# Patient Record
Sex: Female | Born: 1975 | Hispanic: No | Marital: Married | Smoking: Former smoker
Health system: Southern US, Community
[De-identification: ages and names within clinical notes are randomized; demographics above are authoritative.]

## PROBLEM LIST (undated history)

## (undated) DIAGNOSIS — M26629 Arthralgia of temporomandibular joint, unspecified side: Secondary | ICD-10-CM

## (undated) DIAGNOSIS — F418 Other specified anxiety disorders: Secondary | ICD-10-CM

## (undated) DIAGNOSIS — M542 Cervicalgia: Principal | ICD-10-CM

## (undated) DIAGNOSIS — K219 Gastro-esophageal reflux disease without esophagitis: Secondary | ICD-10-CM

## (undated) DIAGNOSIS — D219 Benign neoplasm of connective and other soft tissue, unspecified: Secondary | ICD-10-CM

## (undated) DIAGNOSIS — F419 Anxiety disorder, unspecified: Secondary | ICD-10-CM

## (undated) DIAGNOSIS — G8929 Other chronic pain: Principal | ICD-10-CM

## (undated) HISTORY — DX: Other chronic pain: G89.29

## (undated) HISTORY — DX: Arthralgia of temporomandibular joint, unspecified side: M26.629

## (undated) HISTORY — DX: Other specified anxiety disorders: F41.8

## (undated) HISTORY — DX: Cervicalgia: M54.2

## (undated) HISTORY — PX: NO PAST SURGERIES: SHX2092

## (undated) HISTORY — DX: Gastro-esophageal reflux disease without esophagitis: K21.9

---

## 1997-12-07 ENCOUNTER — Encounter: Admission: RE | Admit: 1997-12-07 | Discharge: 1997-12-07 | Payer: Self-pay | Admitting: Internal Medicine

## 1998-03-03 ENCOUNTER — Other Ambulatory Visit: Admission: RE | Admit: 1998-03-03 | Discharge: 1998-03-03 | Payer: Self-pay | Admitting: Obstetrics

## 1998-03-03 ENCOUNTER — Encounter: Admission: RE | Admit: 1998-03-03 | Discharge: 1998-03-03 | Payer: Self-pay | Admitting: Obstetrics

## 1998-04-14 ENCOUNTER — Encounter: Admission: RE | Admit: 1998-04-14 | Discharge: 1998-04-14 | Payer: Self-pay | Admitting: Obstetrics

## 1998-05-17 ENCOUNTER — Encounter: Admission: RE | Admit: 1998-05-17 | Discharge: 1998-05-17 | Payer: Self-pay | Admitting: Hematology and Oncology

## 1998-06-22 ENCOUNTER — Encounter: Admission: RE | Admit: 1998-06-22 | Discharge: 1998-06-22 | Payer: Self-pay | Admitting: Hematology and Oncology

## 1999-01-23 ENCOUNTER — Inpatient Hospital Stay (HOSPITAL_COMMUNITY): Admission: AD | Admit: 1999-01-23 | Discharge: 1999-01-23 | Payer: Self-pay | Admitting: Obstetrics & Gynecology

## 1999-06-01 ENCOUNTER — Encounter: Admission: RE | Admit: 1999-06-01 | Discharge: 1999-06-01 | Payer: Self-pay | Admitting: Obstetrics

## 1999-07-03 ENCOUNTER — Ambulatory Visit (HOSPITAL_COMMUNITY): Admission: RE | Admit: 1999-07-03 | Discharge: 1999-07-03 | Payer: Self-pay | Admitting: Infectious Diseases

## 1999-08-03 ENCOUNTER — Encounter: Admission: RE | Admit: 1999-08-03 | Discharge: 1999-08-03 | Payer: Self-pay | Admitting: Obstetrics

## 1999-09-11 ENCOUNTER — Encounter: Admission: RE | Admit: 1999-09-11 | Discharge: 1999-09-11 | Payer: Self-pay | Admitting: Internal Medicine

## 1999-10-20 ENCOUNTER — Encounter: Admission: RE | Admit: 1999-10-20 | Discharge: 1999-10-20 | Payer: Self-pay | Admitting: Internal Medicine

## 2000-05-10 ENCOUNTER — Encounter: Admission: RE | Admit: 2000-05-10 | Discharge: 2000-05-10 | Payer: Self-pay | Admitting: Internal Medicine

## 2000-07-15 ENCOUNTER — Encounter: Payer: Self-pay | Admitting: Internal Medicine

## 2000-07-15 ENCOUNTER — Ambulatory Visit (HOSPITAL_COMMUNITY): Admission: RE | Admit: 2000-07-15 | Discharge: 2000-07-15 | Payer: Self-pay | Admitting: Internal Medicine

## 2000-07-19 ENCOUNTER — Ambulatory Visit (HOSPITAL_COMMUNITY): Admission: RE | Admit: 2000-07-19 | Discharge: 2000-07-19 | Payer: Self-pay | Admitting: Internal Medicine

## 2000-07-19 ENCOUNTER — Encounter: Payer: Self-pay | Admitting: Internal Medicine

## 2000-10-10 ENCOUNTER — Encounter: Admission: RE | Admit: 2000-10-10 | Discharge: 2000-10-10 | Payer: Self-pay | Admitting: Obstetrics

## 2001-02-19 ENCOUNTER — Inpatient Hospital Stay (HOSPITAL_COMMUNITY): Admission: AD | Admit: 2001-02-19 | Discharge: 2001-02-19 | Payer: Self-pay | Admitting: Obstetrics

## 2001-05-15 ENCOUNTER — Encounter: Admission: RE | Admit: 2001-05-15 | Discharge: 2001-05-15 | Payer: Self-pay | Admitting: Obstetrics

## 2001-06-11 ENCOUNTER — Encounter: Admission: RE | Admit: 2001-06-11 | Discharge: 2001-06-11 | Payer: Self-pay | Admitting: Internal Medicine

## 2001-12-18 ENCOUNTER — Encounter: Admission: RE | Admit: 2001-12-18 | Discharge: 2001-12-18 | Payer: Self-pay | Admitting: Internal Medicine

## 2002-04-02 ENCOUNTER — Encounter: Admission: RE | Admit: 2002-04-02 | Discharge: 2002-04-02 | Payer: Self-pay | Admitting: Obstetrics and Gynecology

## 2002-04-02 ENCOUNTER — Encounter (INDEPENDENT_AMBULATORY_CARE_PROVIDER_SITE_OTHER): Payer: Self-pay | Admitting: Specialist

## 2003-02-01 ENCOUNTER — Encounter: Admission: RE | Admit: 2003-02-01 | Discharge: 2003-02-01 | Payer: Self-pay | Admitting: Internal Medicine

## 2003-02-03 ENCOUNTER — Encounter: Admission: RE | Admit: 2003-02-03 | Discharge: 2003-02-03 | Payer: Self-pay | Admitting: Internal Medicine

## 2004-02-01 ENCOUNTER — Encounter: Admission: RE | Admit: 2004-02-01 | Discharge: 2004-02-01 | Payer: Self-pay | Admitting: Internal Medicine

## 2004-10-21 ENCOUNTER — Inpatient Hospital Stay (HOSPITAL_COMMUNITY): Admission: AD | Admit: 2004-10-21 | Discharge: 2004-10-21 | Payer: Self-pay | Admitting: Obstetrics & Gynecology

## 2004-10-26 ENCOUNTER — Ambulatory Visit: Payer: Self-pay | Admitting: Internal Medicine

## 2005-05-07 ENCOUNTER — Ambulatory Visit: Payer: Self-pay | Admitting: Internal Medicine

## 2007-11-13 ENCOUNTER — Inpatient Hospital Stay (HOSPITAL_COMMUNITY): Admission: AD | Admit: 2007-11-13 | Discharge: 2007-11-14 | Payer: Self-pay | Admitting: Obstetrics & Gynecology

## 2007-11-16 ENCOUNTER — Inpatient Hospital Stay (HOSPITAL_COMMUNITY): Admission: AD | Admit: 2007-11-16 | Discharge: 2007-11-17 | Payer: Self-pay | Admitting: Obstetrics

## 2007-11-17 ENCOUNTER — Encounter (INDEPENDENT_AMBULATORY_CARE_PROVIDER_SITE_OTHER): Payer: Self-pay | Admitting: Internal Medicine

## 2007-11-17 ENCOUNTER — Ambulatory Visit: Payer: Self-pay | Admitting: Internal Medicine

## 2007-11-17 DIAGNOSIS — N920 Excessive and frequent menstruation with regular cycle: Secondary | ICD-10-CM

## 2007-11-17 LAB — CONVERTED CEMR LAB
Basophils Absolute: 0 10*3/uL (ref 0.0–0.1)
HCT: 38.3 % (ref 36.0–46.0)
HCV Ab: NEGATIVE
Hep A Total Ab: NEGATIVE
Hep B Core Total Ab: NEGATIVE
Hepatitis B Surface Ag: NEGATIVE
Lymphocytes Relative: 15 % (ref 12–46)
Lymphs Abs: 1.4 10*3/uL (ref 0.7–4.0)
Neutro Abs: 7.1 10*3/uL (ref 1.7–7.7)
Neutrophils Relative %: 75 % (ref 43–77)
Platelets: 222 10*3/uL (ref 150–400)
RDW: 13 % (ref 11.5–15.5)
WBC: 9.4 10*3/uL (ref 4.0–10.5)

## 2007-11-20 ENCOUNTER — Ambulatory Visit: Payer: Self-pay | Admitting: Infectious Disease

## 2007-12-17 ENCOUNTER — Encounter (INDEPENDENT_AMBULATORY_CARE_PROVIDER_SITE_OTHER): Payer: Self-pay | Admitting: Internal Medicine

## 2007-12-17 ENCOUNTER — Ambulatory Visit: Payer: Self-pay | Admitting: *Deleted

## 2007-12-17 DIAGNOSIS — A5601 Chlamydial cystitis and urethritis: Secondary | ICD-10-CM

## 2007-12-17 LAB — CONVERTED CEMR LAB
Bilirubin Urine: NEGATIVE
Candida species: NEGATIVE
GC Probe Amp, Genital: NEGATIVE
Ketones, ur: NEGATIVE mg/dL
RBC / HPF: NONE SEEN (ref <3)
Specific Gravity, Urine: 1.006 (ref 1.005–1.03)
Urine Glucose: NEGATIVE mg/dL
WBC, UA: NONE SEEN cells/hpf (ref <3)
pH: 7 (ref 5.0–8.0)

## 2007-12-20 ENCOUNTER — Emergency Department (HOSPITAL_COMMUNITY): Admission: EM | Admit: 2007-12-20 | Discharge: 2007-12-20 | Payer: Self-pay | Admitting: Emergency Medicine

## 2007-12-23 ENCOUNTER — Ambulatory Visit (HOSPITAL_COMMUNITY): Admission: RE | Admit: 2007-12-23 | Discharge: 2007-12-23 | Payer: Self-pay | Admitting: Internal Medicine

## 2007-12-23 ENCOUNTER — Encounter: Payer: Self-pay | Admitting: Internal Medicine

## 2008-01-08 DIAGNOSIS — J029 Acute pharyngitis, unspecified: Secondary | ICD-10-CM | POA: Insufficient documentation

## 2008-01-13 ENCOUNTER — Ambulatory Visit: Payer: Self-pay | Admitting: *Deleted

## 2008-01-13 ENCOUNTER — Encounter (INDEPENDENT_AMBULATORY_CARE_PROVIDER_SITE_OTHER): Payer: Self-pay | Admitting: *Deleted

## 2008-04-21 ENCOUNTER — Ambulatory Visit: Payer: Self-pay | Admitting: Internal Medicine

## 2008-04-21 ENCOUNTER — Encounter: Payer: Self-pay | Admitting: Internal Medicine

## 2008-04-21 DIAGNOSIS — J069 Acute upper respiratory infection, unspecified: Secondary | ICD-10-CM | POA: Insufficient documentation

## 2008-07-02 ENCOUNTER — Telehealth: Payer: Self-pay | Admitting: *Deleted

## 2008-07-05 ENCOUNTER — Encounter (INDEPENDENT_AMBULATORY_CARE_PROVIDER_SITE_OTHER): Payer: Self-pay | Admitting: Internal Medicine

## 2008-07-05 ENCOUNTER — Ambulatory Visit: Payer: Self-pay | Admitting: *Deleted

## 2008-07-05 DIAGNOSIS — R42 Dizziness and giddiness: Secondary | ICD-10-CM

## 2008-07-05 LAB — CONVERTED CEMR LAB
Albumin: 4.2 g/dL (ref 3.5–5.2)
Alkaline Phosphatase: 49 units/L (ref 39–117)
BUN: 11 mg/dL (ref 6–23)
Beta hcg, urine, semiquantitative: NEGATIVE
CO2: 24 meq/L (ref 19–32)
Cortisol, Plasma: 11.1 ug/dL
Ferritin: 9 ng/mL — ABNORMAL LOW (ref 10–291)
Glucose, Bld: 84 mg/dL (ref 70–99)
Hemoglobin: 13 g/dL (ref 12.0–15.0)
Iron: 98 ug/dL (ref 42–145)
MCHC: 32.8 g/dL (ref 30.0–36.0)
MCV: 96.8 fL (ref 78.0–100.0)
Potassium: 4.8 meq/L (ref 3.5–5.3)
RBC: 4.09 M/uL (ref 3.87–5.11)
Total Bilirubin: 0.6 mg/dL (ref 0.3–1.2)

## 2008-07-07 ENCOUNTER — Telehealth (INDEPENDENT_AMBULATORY_CARE_PROVIDER_SITE_OTHER): Payer: Self-pay | Admitting: Internal Medicine

## 2008-09-14 ENCOUNTER — Telehealth: Payer: Self-pay | Admitting: Internal Medicine

## 2008-10-27 ENCOUNTER — Ambulatory Visit: Payer: Self-pay | Admitting: *Deleted

## 2008-10-27 ENCOUNTER — Encounter (INDEPENDENT_AMBULATORY_CARE_PROVIDER_SITE_OTHER): Payer: Self-pay | Admitting: Internal Medicine

## 2008-10-28 ENCOUNTER — Encounter (INDEPENDENT_AMBULATORY_CARE_PROVIDER_SITE_OTHER): Payer: Self-pay | Admitting: Internal Medicine

## 2008-10-28 LAB — CONVERTED CEMR LAB
Candida species: NEGATIVE
Chlamydia, DNA Probe: NEGATIVE

## 2008-11-20 ENCOUNTER — Emergency Department (HOSPITAL_COMMUNITY): Admission: EM | Admit: 2008-11-20 | Discharge: 2008-11-20 | Payer: Self-pay | Admitting: Emergency Medicine

## 2009-04-16 IMAGING — US US ABDOMEN COMPLETE
1 series · 14 of 25 positions shown · non-contrast
Comparison: None available.

CLINICAL DATA: Right-sided abdominal pain.

ABDOMEN ULTRASOUND
TECHNIQUE: Complete abdominal ultrasound examination was performed
including evaluation of the liver, gallbladder, bile ducts,
pancreas, kidneys, spleen, IVC, and abdominal aorta.

[Series 1: unknown · 0.34mm/px · 14 of 53 slices shown]
[im 1/53]
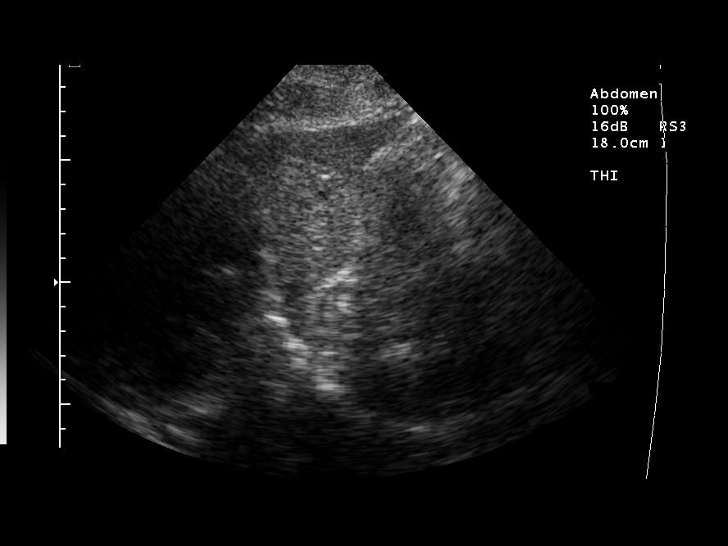
[im 5/53]
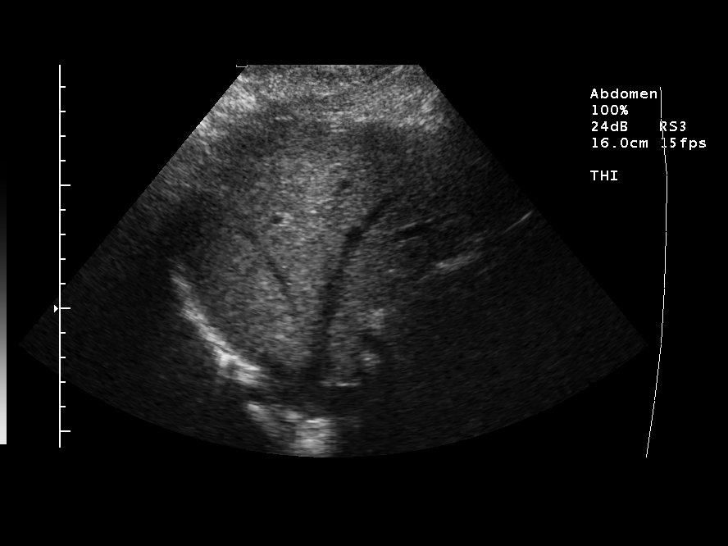
[im 9/53]
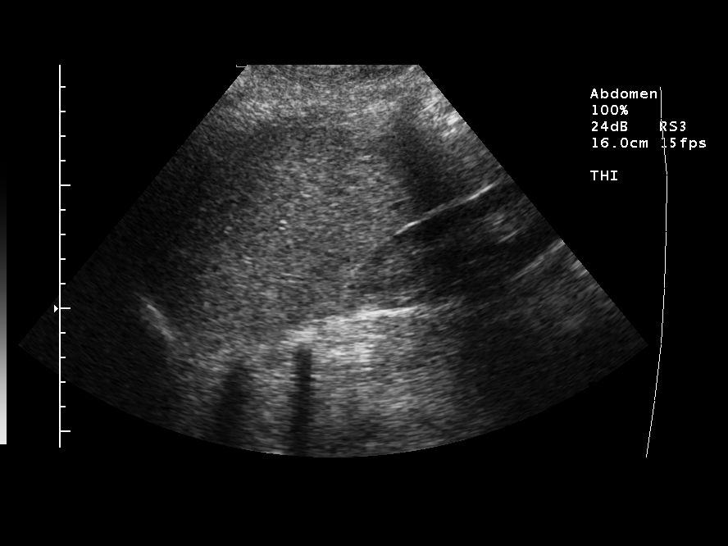
[im 14/53]
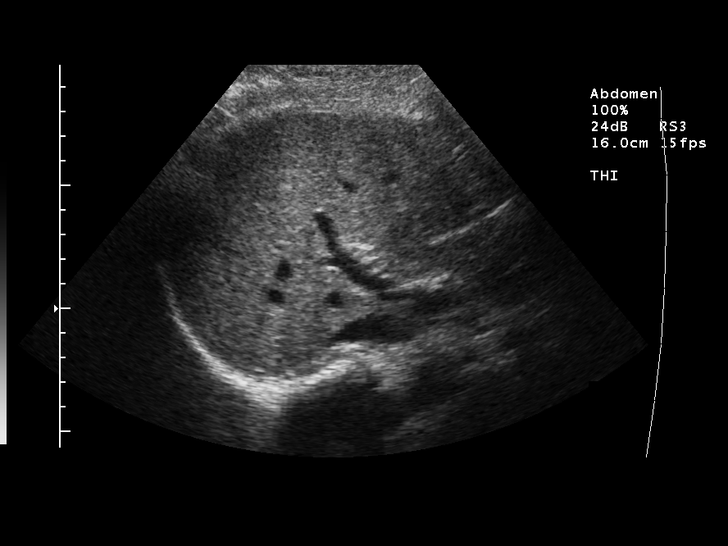
[im 18/53]
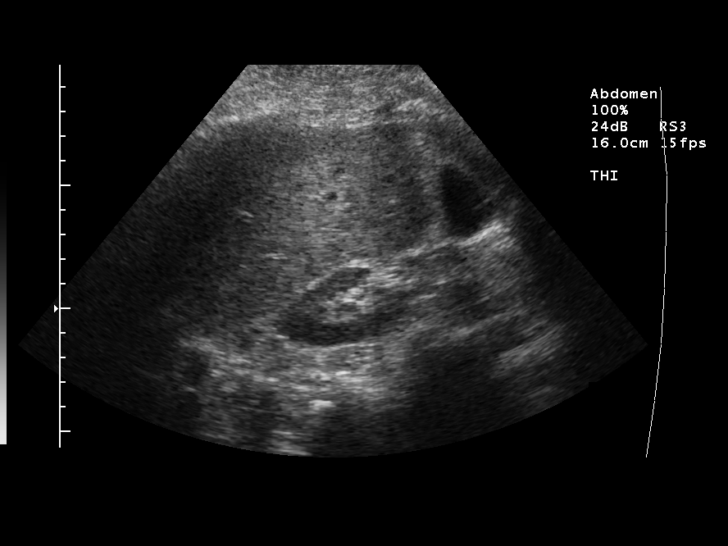
[im 20/53]
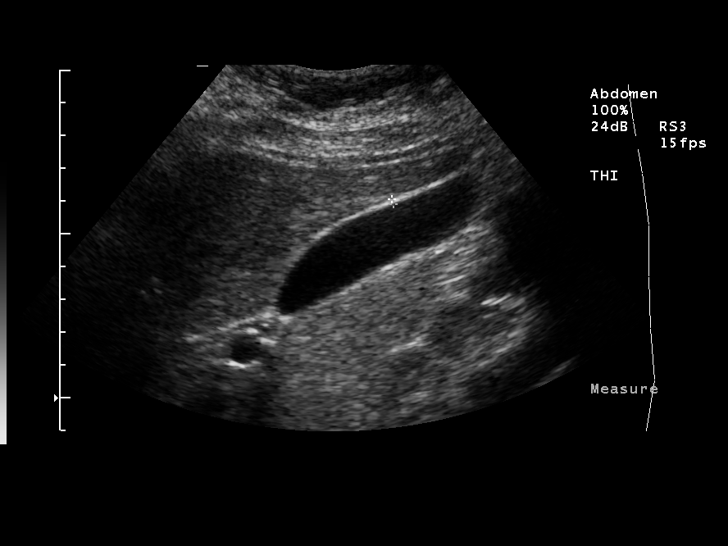
[im 24/53]
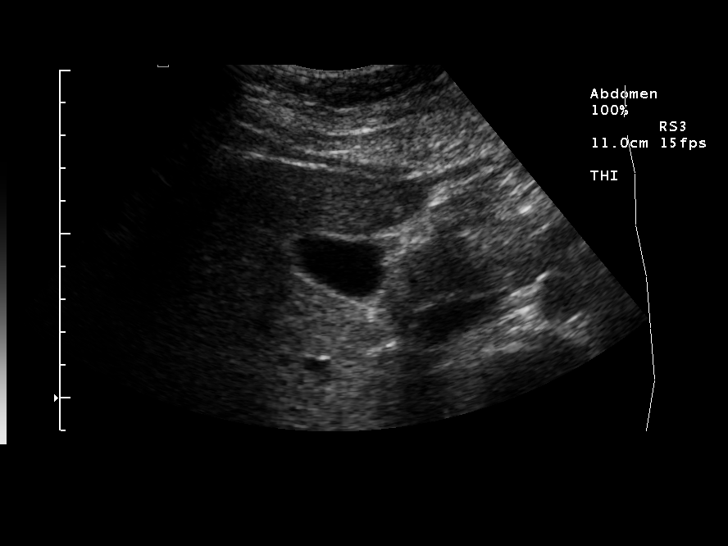
[im 29/53]
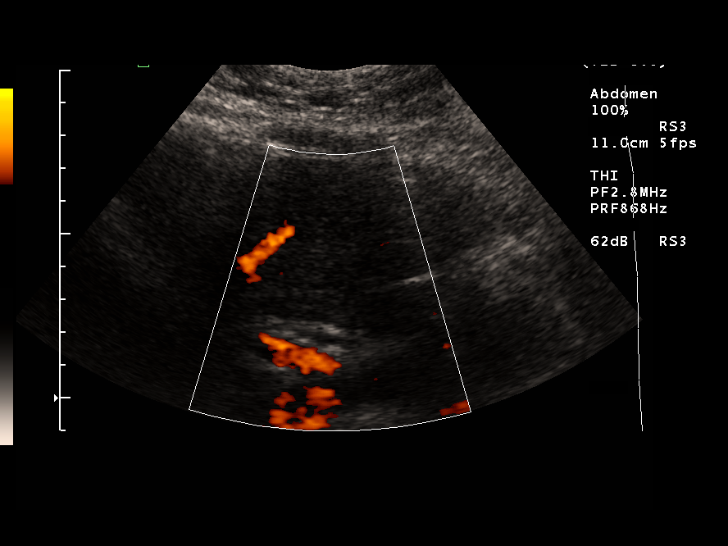
[im 33/53]
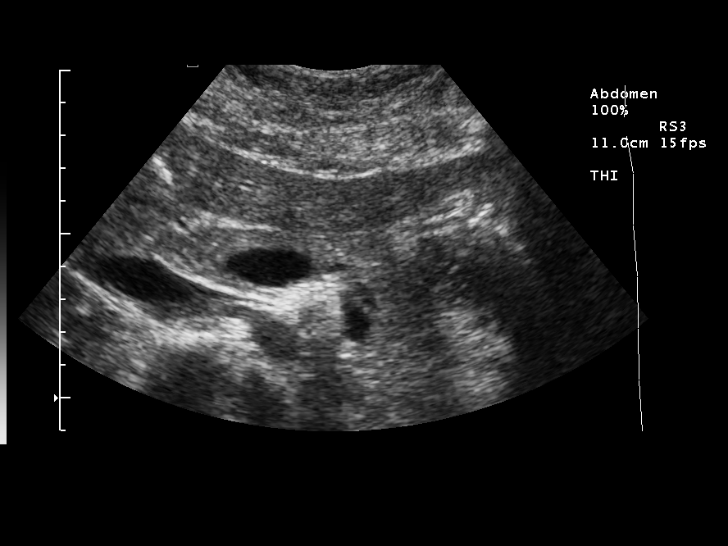
[im 35/53]
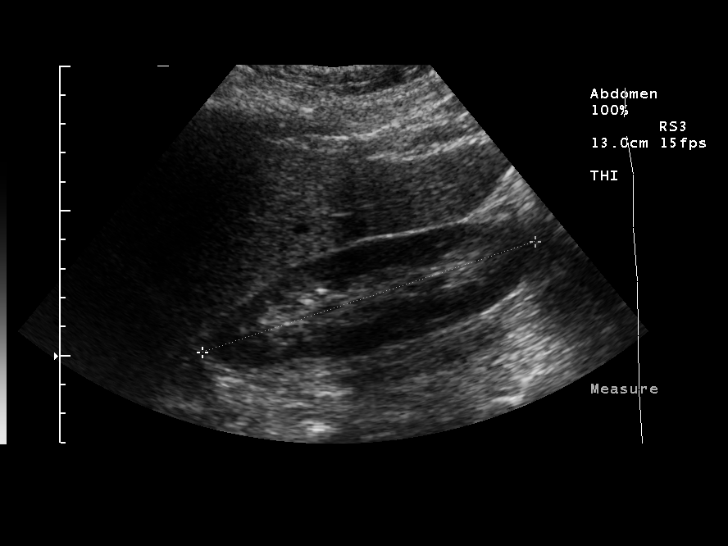
[im 40/53]
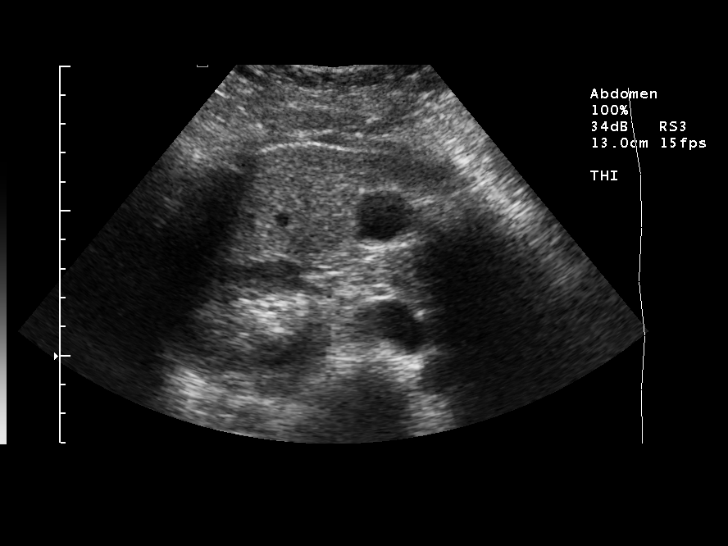
[im 44/53]
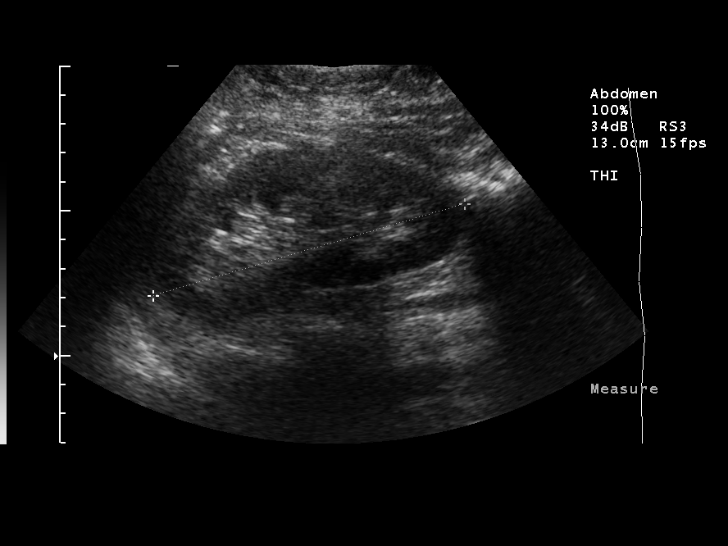
[im 48/53]
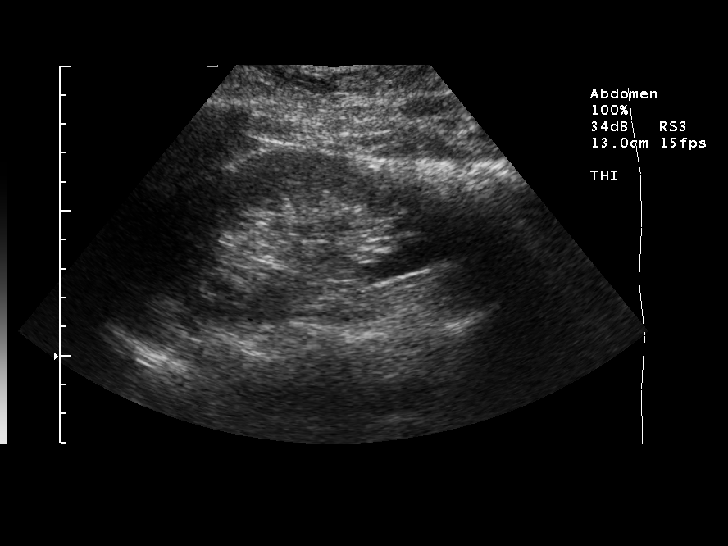
[im 53/53]
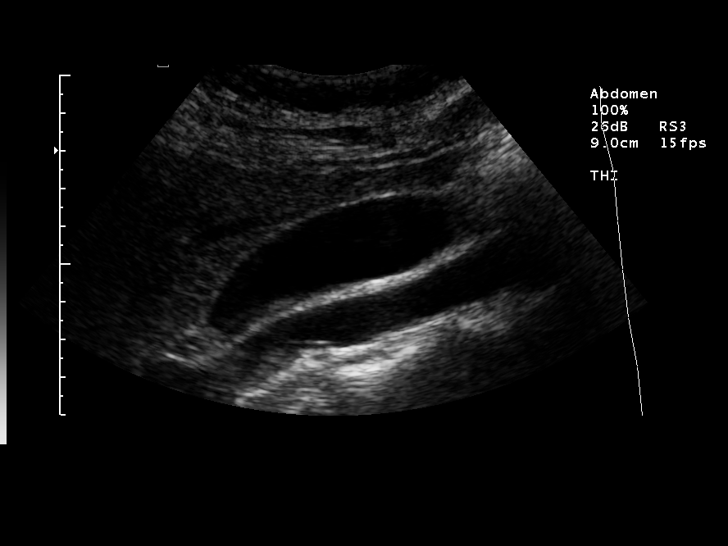

[14 of 25 positions shown; findings below may reference images not displayed]

FINDINGS: The gallbladder appears normal without stones, wall
thickening or pericholecystic fluid.

Common bile duct is normal 4.0 mm.

The liver, inferior vena cava and pancreas all appear normal.

The spleen measures 8.8 cm in length and appears normal.

Both the right and left kidney measure 12.1 cm and appear normal.

No abdominal aortic aneurysm.
IMPRESSION: Negative exam.  Specifically, the gallbladder appears normal.

## 2009-09-18 ENCOUNTER — Emergency Department (HOSPITAL_BASED_OUTPATIENT_CLINIC_OR_DEPARTMENT_OTHER): Admission: EM | Admit: 2009-09-18 | Discharge: 2009-09-19 | Payer: Self-pay | Admitting: Emergency Medicine

## 2009-09-19 ENCOUNTER — Ambulatory Visit: Payer: Self-pay | Admitting: Diagnostic Radiology

## 2009-11-13 ENCOUNTER — Emergency Department (HOSPITAL_BASED_OUTPATIENT_CLINIC_OR_DEPARTMENT_OTHER): Admission: EM | Admit: 2009-11-13 | Discharge: 2009-11-13 | Payer: Self-pay | Admitting: Emergency Medicine

## 2010-05-06 ENCOUNTER — Ambulatory Visit: Payer: Self-pay | Admitting: Obstetrics and Gynecology

## 2010-05-06 ENCOUNTER — Inpatient Hospital Stay (HOSPITAL_COMMUNITY): Admission: AD | Admit: 2010-05-06 | Discharge: 2010-05-07 | Payer: Self-pay | Admitting: Obstetrics

## 2010-05-09 ENCOUNTER — Inpatient Hospital Stay (HOSPITAL_COMMUNITY): Admission: AD | Admit: 2010-05-09 | Discharge: 2010-05-09 | Payer: Self-pay | Admitting: Obstetrics

## 2010-05-09 ENCOUNTER — Ambulatory Visit: Payer: Self-pay | Admitting: Gynecology

## 2010-05-11 ENCOUNTER — Inpatient Hospital Stay (HOSPITAL_COMMUNITY): Admission: AD | Admit: 2010-05-11 | Discharge: 2010-05-11 | Payer: Self-pay | Admitting: Obstetrics & Gynecology

## 2010-05-13 ENCOUNTER — Inpatient Hospital Stay (HOSPITAL_COMMUNITY): Admission: AD | Admit: 2010-05-13 | Discharge: 2010-05-13 | Payer: Self-pay | Admitting: Obstetrics & Gynecology

## 2010-09-02 ENCOUNTER — Encounter: Payer: Self-pay | Admitting: Obstetrics

## 2010-09-03 ENCOUNTER — Encounter: Payer: Self-pay | Admitting: Internal Medicine

## 2010-10-25 LAB — CBC
Hemoglobin: 11.9 g/dL — ABNORMAL LOW (ref 12.0–15.0)
Platelets: 272 10*3/uL (ref 150–400)
RBC: 3.8 MIL/uL — ABNORMAL LOW (ref 3.87–5.11)
WBC: 8.8 10*3/uL (ref 4.0–10.5)

## 2010-10-25 LAB — DIFFERENTIAL
Basophils Absolute: 0.1 10*3/uL (ref 0.0–0.1)
Basophils Relative: 1 % (ref 0–1)
Eosinophils Absolute: 0.2 10*3/uL (ref 0.0–0.7)
Monocytes Relative: 8 % (ref 3–12)
Neutro Abs: 5.5 10*3/uL (ref 1.7–7.7)
Neutrophils Relative %: 63 % (ref 43–77)

## 2010-10-25 LAB — CREATININE, SERUM
Creatinine, Ser: 0.74 mg/dL (ref 0.4–1.2)
GFR calc Af Amer: >60 mL/min (ref >60)
GFR calc non Af Amer: >60 mL/min (ref >60)

## 2010-10-25 LAB — AST: AST: 21 U/L (ref 0–37)

## 2010-10-25 LAB — BUN: BUN: 8 mg/dL (ref 6–23)

## 2010-10-26 LAB — CBC
HCT: 36.2 % (ref 36.0–46.0)
Hemoglobin: 11.7 g/dL — ABNORMAL LOW (ref 12.0–15.0)
Hemoglobin: 12.1 g/dL (ref 12.0–15.0)
MCH: 30.9 pg (ref 26.0–34.0)
MCH: 31 pg (ref 26.0–34.0)
Platelets: 248 10*3/uL (ref 150–400)
RBC: 3.76 MIL/uL — ABNORMAL LOW (ref 3.87–5.11)
RBC: 3.9 MIL/uL (ref 3.87–5.11)

## 2010-10-26 LAB — HCG, QUANTITATIVE, PREGNANCY
hCG, Beta Chain, Quant, S: 276 m[IU]/mL — ABNORMAL HIGH (ref <5)
hCG, Beta Chain, Quant, S: 787 m[IU]/mL — ABNORMAL HIGH (ref <5)
hCG, Beta Chain, Quant, S: 959 m[IU]/mL — ABNORMAL HIGH (ref <5)

## 2010-10-26 LAB — GC/CHLAMYDIA PROBE AMP, GENITAL: Chlamydia, DNA Probe: NEGATIVE

## 2010-10-26 LAB — ABO/RH: ABO/RH(D): A POS

## 2010-10-26 LAB — WET PREP, GENITAL

## 2010-11-01 LAB — URINE MICROSCOPIC-ADD ON

## 2010-11-01 LAB — BASIC METABOLIC PANEL
CO2: 21 mEq/L (ref 19–32)
Chloride: 105 mEq/L (ref 96–112)
GFR calc Af Amer: >60 mL/min (ref >60)
Potassium: 4.1 mEq/L (ref 3.5–5.1)

## 2010-11-01 LAB — URINALYSIS, ROUTINE W REFLEX MICROSCOPIC
Nitrite: NEGATIVE
Specific Gravity, Urine: 1.014 (ref 1.005–1.030)
pH: 6 (ref 5.0–8.0)

## 2010-11-01 LAB — PREGNANCY, URINE: Preg Test, Ur: NEGATIVE

## 2010-12-04 ENCOUNTER — Inpatient Hospital Stay (HOSPITAL_COMMUNITY)
Admission: AD | Admit: 2010-12-04 | Discharge: 2010-12-04 | Disposition: A | Discharge status: Home / Self Care | Payer: BC Managed Care – PPO | Source: Ambulatory Visit | Attending: Obstetrics | Admitting: Obstetrics

## 2010-12-04 ENCOUNTER — Inpatient Hospital Stay (HOSPITAL_COMMUNITY): Payer: BC Managed Care – PPO

## 2010-12-04 DIAGNOSIS — N938 Other specified abnormal uterine and vaginal bleeding: Secondary | ICD-10-CM | POA: Insufficient documentation

## 2010-12-04 DIAGNOSIS — N949 Unspecified condition associated with female genital organs and menstrual cycle: Secondary | ICD-10-CM | POA: Insufficient documentation

## 2010-12-04 LAB — URINE MICROSCOPIC-ADD ON

## 2010-12-04 LAB — URINALYSIS, ROUTINE W REFLEX MICROSCOPIC
Bilirubin Urine: NEGATIVE
Glucose, UA: NEGATIVE mg/dL
Ketones, ur: NEGATIVE mg/dL
Protein, ur: NEGATIVE mg/dL

## 2010-12-04 LAB — WET PREP, GENITAL
Trich, Wet Prep: NONE SEEN
Yeast Wet Prep HPF POC: NONE SEEN

## 2010-12-04 LAB — CBC
MCV: 89.8 fL (ref 78.0–100.0)
Platelets: 259 10*3/uL (ref 150–400)
RDW: 15.7 % — ABNORMAL HIGH (ref 11.5–15.5)
WBC: 7.9 10*3/uL (ref 4.0–10.5)

## 2010-12-23 ENCOUNTER — Encounter: Payer: Self-pay | Admitting: Internal Medicine

## 2011-03-08 ENCOUNTER — Encounter (HOSPITAL_COMMUNITY): Payer: Self-pay

## 2011-03-08 ENCOUNTER — Inpatient Hospital Stay (HOSPITAL_COMMUNITY)
Admission: AD | Admit: 2011-03-08 | Discharge: 2011-03-08 | Disposition: A | Discharge status: Home / Self Care | Payer: BC Managed Care – PPO | Source: Ambulatory Visit | Attending: Obstetrics | Admitting: Obstetrics

## 2011-03-08 DIAGNOSIS — N898 Other specified noninflammatory disorders of vagina: Secondary | ICD-10-CM | POA: Insufficient documentation

## 2011-03-08 DIAGNOSIS — B373 Candidiasis of vulva and vagina: Secondary | ICD-10-CM

## 2011-03-08 DIAGNOSIS — B3731 Acute candidiasis of vulva and vagina: Secondary | ICD-10-CM | POA: Insufficient documentation

## 2011-03-08 DIAGNOSIS — N949 Unspecified condition associated with female genital organs and menstrual cycle: Secondary | ICD-10-CM | POA: Insufficient documentation

## 2011-03-08 DIAGNOSIS — F17200 Nicotine dependence, unspecified, uncomplicated: Secondary | ICD-10-CM | POA: Insufficient documentation

## 2011-03-08 HISTORY — DX: Anxiety disorder, unspecified: F41.9

## 2011-03-08 LAB — WET PREP, GENITAL: Trich, Wet Prep: NONE SEEN

## 2011-03-08 LAB — POCT PREGNANCY, URINE: Preg Test, Ur: NEGATIVE

## 2011-03-08 NOTE — Progress Notes (Signed)
Pt states she feels bloated, is in a committed relationship, [place] had un[place] ? Yeast infection.

## 2011-03-08 NOTE — Progress Notes (Signed)
Pt states she has had a little pain with urinating and vaginal itch and some pain with intercourse. Has been using OTC Monistat thinking it is a yeast infection.

## 2011-03-08 NOTE — ED Provider Notes (Signed)
History     Chief Complaint  Patient presents with  . Vaginal Discharge   The history is provided by the patient.   Susan Brewer is a 35 y.o. female with c/o vaginal discharge and itching. She states that last week she and her partner had unprotected sex multiple times and as a result she has had vaginal irritation, itching and a thick white discharge. She gets her routine GYN care with Dr. Clearance Coots.    Past Medical History  Diagnosis Date  . Asthma   . Anxiety     Past Surgical History  Procedure Date  . No past surgeries     No family history on file.  History  Substance Use Topics  . Smoking status: Passive Smoker  . Smokeless tobacco: Never Used  . Alcohol Use: 1.2 oz/week    2 Glasses of wine per week    OB History    Grav Para Term Preterm Abortions TAB SAB Ect Mult Living   2 1 1  1   1  1       Review of Systems  Constitutional: Negative for fever, chills, diaphoresis and fatigue.  HENT: Negative for ear pain, congestion, sore throat, facial swelling, neck pain, neck stiffness, dental problem and sinus pressure.   Eyes: Negative for photophobia, pain and discharge.  Respiratory: Negative for cough, chest tightness and wheezing.   Gastrointestinal: Negative for nausea, vomiting, abdominal pain, diarrhea, constipation and abdominal distention.  Genitourinary: Positive for vaginal discharge and vaginal pain. Negative for dysuria, frequency, flank pain, vaginal bleeding and difficulty urinating.  Musculoskeletal: Negative for myalgias, back pain and gait problem.  Skin: Negative for color change and rash.  Neurological: Negative for dizziness, speech difficulty, weakness, light-headedness, numbness and headaches.  Psychiatric/Behavioral: Negative for confusion and agitation.    Physical Exam  BP 114/65  Pulse 69  Temp(Src) 99.2 F (37.3 C) (Oral)  Resp 16  Ht 5' 5.5" (1.664 m)  Wt 170 lb 9.6 oz (77.384 kg)  BMI 27.96 kg/m2  SpO2 100%  LMP  02/16/2011  Physical Exam  Nursing note and vitals reviewed. Constitutional: She is oriented to person, place, and time. She appears well-developed and well-nourished.  Eyes: EOM are normal.  Neck: Neck supple.  Pulmonary/Chest: Effort normal.  Abdominal: Soft. There is no tenderness.  Genitourinary: Uterus normal. Cervix exhibits no motion tenderness. Right adnexum displays no tenderness. Left adnexum displays no tenderness. Vaginal discharge found.       Thick white discharge in the vaginal vault. No bleeding.   Musculoskeletal: Normal range of motion.  Neurological: She is alert and oriented to person, place, and time. No cranial nerve deficit.  Skin: Skin is warm and dry.    ED Course  Procedures  MDM        Kerrie Buffalo, NP 03/13/11 [phone]

## 2011-03-11 NOTE — ED Provider Notes (Signed)
Positive Chlamydia. Will fax result to Dr Clearance Coots and let his office call patient for treatment.

## 2011-05-08 LAB — URINALYSIS, ROUTINE W REFLEX MICROSCOPIC
Glucose, UA: NEGATIVE
Leukocytes, UA: NEGATIVE
Nitrite: NEGATIVE
Protein, ur: NEGATIVE
Urobilinogen, UA: 0.2

## 2011-05-08 LAB — URINE MICROSCOPIC-ADD ON

## 2011-05-08 LAB — GC/CHLAMYDIA PROBE AMP, GENITAL: Chlamydia, DNA Probe: POSITIVE — AB

## 2011-05-08 LAB — WET PREP, GENITAL
Clue Cells Wet Prep HPF POC: NONE SEEN
Trich, Wet Prep: NONE SEEN
Yeast Wet Prep HPF POC: NONE SEEN

## 2011-05-21 ENCOUNTER — Ambulatory Visit (INDEPENDENT_AMBULATORY_CARE_PROVIDER_SITE_OTHER): Payer: BC Managed Care – PPO | Admitting: Internal Medicine

## 2011-05-21 ENCOUNTER — Encounter: Payer: Self-pay | Admitting: Internal Medicine

## 2011-05-21 DIAGNOSIS — J069 Acute upper respiratory infection, unspecified: Secondary | ICD-10-CM

## 2011-05-21 NOTE — Assessment & Plan Note (Signed)
Patient most likely had viral URI. Conservative therapy recommended. Patient was given a work note for one day. She should be able to go to work tomorrow. I informed her is has worsening symptoms she should be seen again.

## 2011-05-21 NOTE — Progress Notes (Signed)
  Subjective:   Patient ID: Susan Brewer female   DOB: 1975/12/03 35 y.o.   MRN: [id number]  HPI: [name] is a 35 y.o.  Female with PMH significant for upper respiratory symptoms. She noted that it started one day ago with nasal congestion, dry cough, throat pain. She felt very weak and had fevers and chills yesterday. She took some tylenol and since this morning she is feeling better.     Past Medical History  Diagnosis Date  . Asthma   . Anxiety    Current Outpatient Prescriptions  Medication Sig Dispense Refill  . acetaminophen (TYLENOL 8 HOUR) 650 MG CR tablet Take 650 mg by mouth every 8 (eight) hours as needed.       . ALBUTEROL IN Inhale 1 puff into the lungs daily as needed. As needed for shortness of breath       . Cyanocobalamin (VITAMIN B-12) 2500 MCG SUBL Place 1 tablet under the tongue daily.        . ferrous gluconate (FERGON) 325 MG tablet Take 325 mg by mouth 2 (two) times daily.       Marland Kitchen ibuprofen (ADVIL,MOTRIN) 200 MG tablet Take 600 mg by mouth every 6 (six) hours as needed. Take as needed for muscle tension, pain       Review of Systems: Constitutional: noted  fever, chills, diaphoresis, appetite change and fatigue for one day.  HEENT: noted congestion, sore throat, rhinorrhea, sneezing but denies trouble swallowing, neck pain, neck stiffness and tinnitus.   Respiratory: Noted SOB,  cough, but denies DOE,chest tightness,  and wheezing.   Cardiovascular: Denies chest pain, palpitations and leg swelling.  Gastrointestinal: Denies nausea, vomiting, abdominal pain, diarrhea, constipation, blood in stool and abdominal distention.  Genitourinary: Denies dysuria, urgency, frequency, hematuria, flank pain and difficulty urinating.  Skin: Denies pallor, rash and wound.  Neurological: Denies dizziness, light-headedness, numbness and headaches.    Objective:  Physical Exam: Filed Vitals:   05/21/11 1155  BP: 106/75  Pulse: 74  Temp: 98.4 F (36.9 C)  TempSrc:  Oral  Height: 5\' 5"  (1.651 m)  Weight: 167 lb 1.6 oz (75.796 kg)   Constitutional: Vital signs reviewed.  Patient is a well-developed and well-nourished  in no acute distress and cooperative with exam. Alert and oriented x3.  Mouth: no erythema or exudates, MMM Neck: Supple, Cardiovascular: RRR, S1 normal, S2 normal, no MRG, pulses symmetric and intact bilaterally Pulmonary/Chest: CTAB, no wheezes, rales, or rhonchi Abdominal: Soft. Non-tender, non-distended, bowel sounds are normal, no masses, organomegaly, or guarding present.  GU: no CVA tenderness Musculoskeletal: No joint deformities, erythema, or stiffness, ROM full and no nontender Neurological: A&O x3,no focal motor deficit,  Skin: Warm, dry and intact. No rash, cyanosis, or clubbing.

## 2011-05-21 NOTE — Progress Notes (Deleted)
  Subjective:    Patient ID: Susan Brewer, female    DOB: 03-05-1976, 35 y.o.   MRN: [id number]  HPI    Review of Systems     Objective:   Physical Exam        Assessment & Plan:

## 2011-09-08 IMAGING — US US OB COMP LESS 14 WK
1 series · 13 of 28 positions shown · non-contrast
Comparison: Pelvic ultrasound dated 12/23/2007.

CLINICAL DATA: Vaginal bleeding and pelvic cramping at 5 weeks and
4 days of pregnancy by last menstrual period.  Quantitative beta
HCG 983.

OBSTETRIC <14 WK US AND TRANSVAGINAL OB US
TECHNIQUE: Both transabdominal and transvaginal ultrasound
examinations were performed for complete evaluation of the
gestation as well as the maternal uterus, adnexal regions, and
pelvic cul-de-sac.

[Series 1: us ob comp less 14 wks · 0.21mm/px · 13 of 58 slices shown]
[im 3/58]
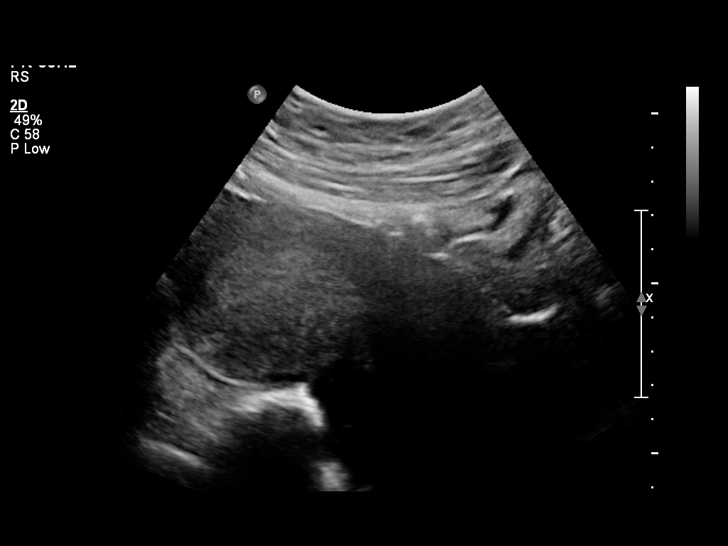
[im 7/58]
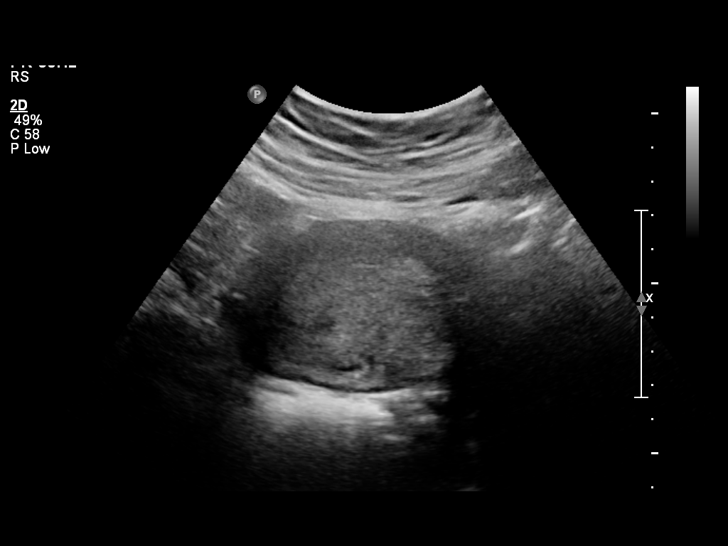
[im 11/58]
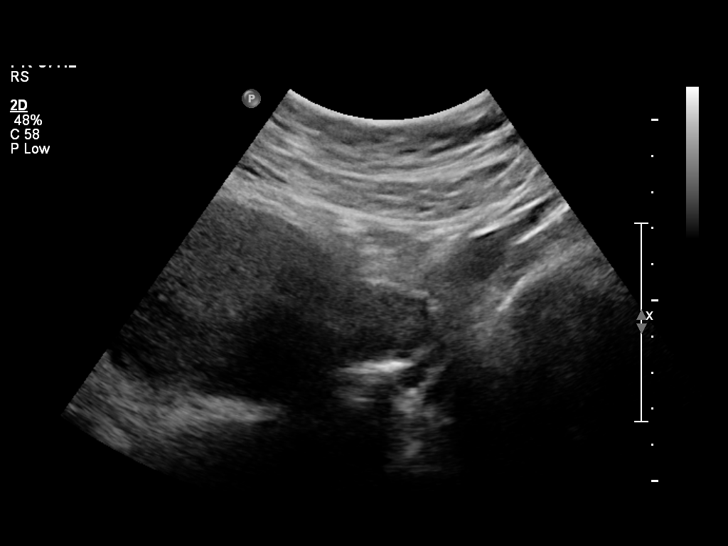
[im 15/58]
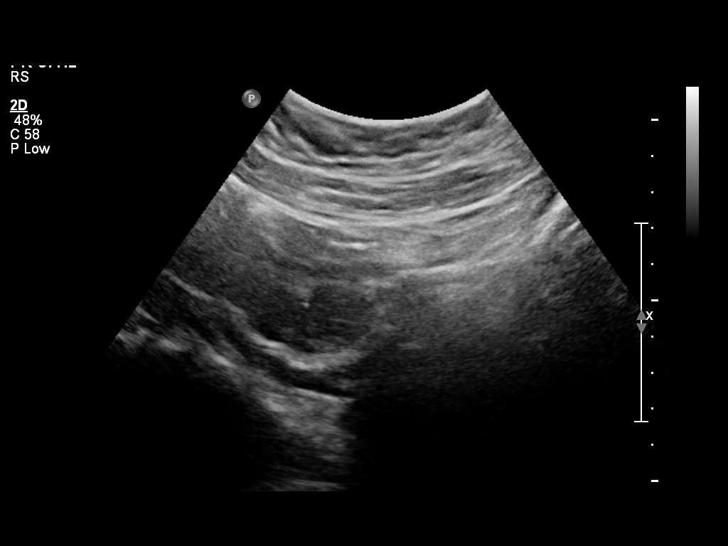
[im 20/58]
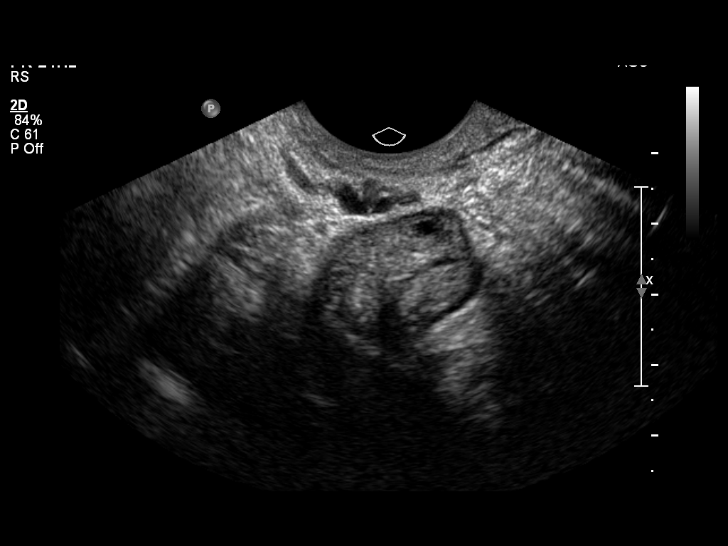
[im 24/58]
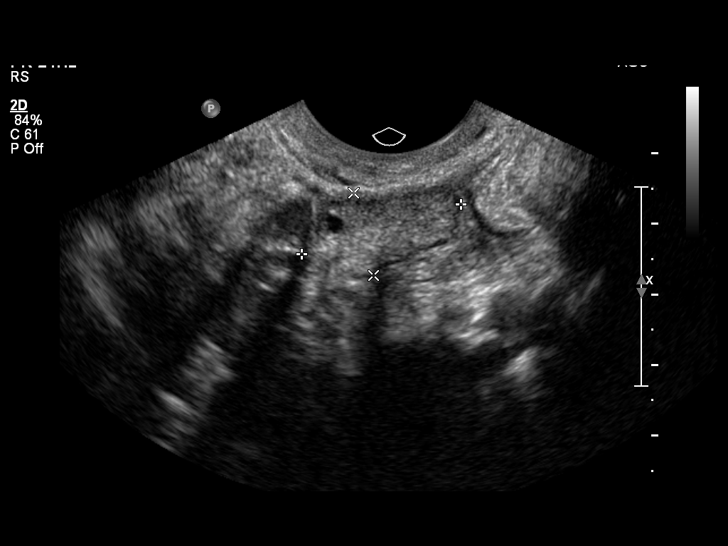
[im 30/58]
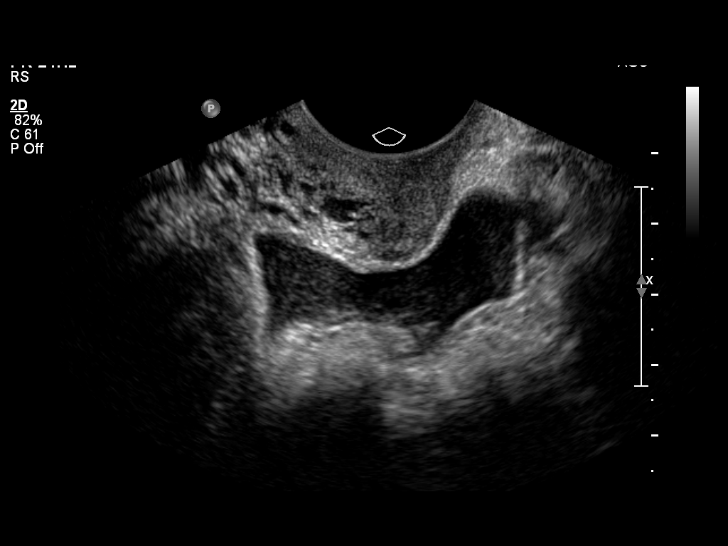
[im 34/58]
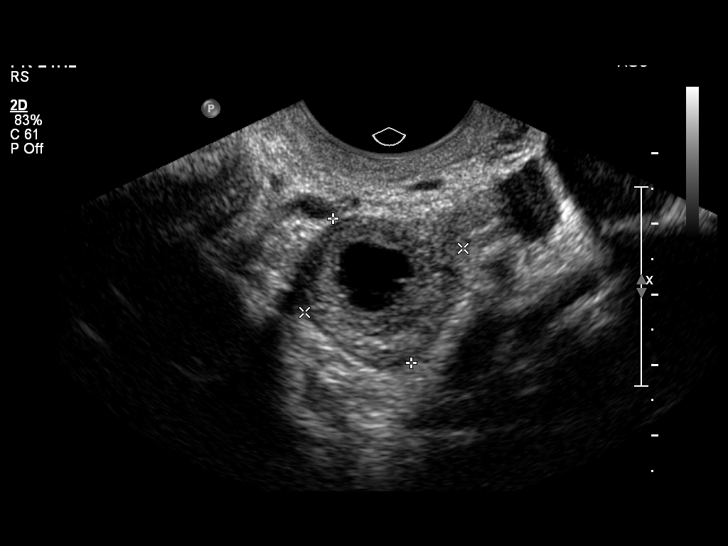
[im 39/58]
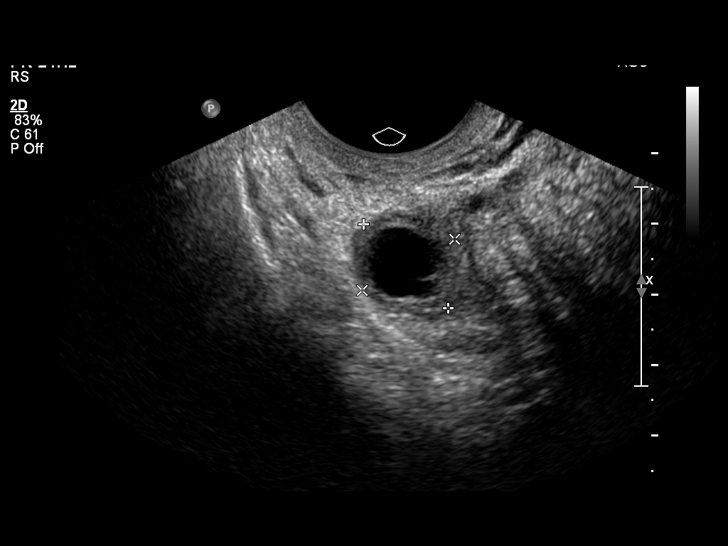
[im 43/58]
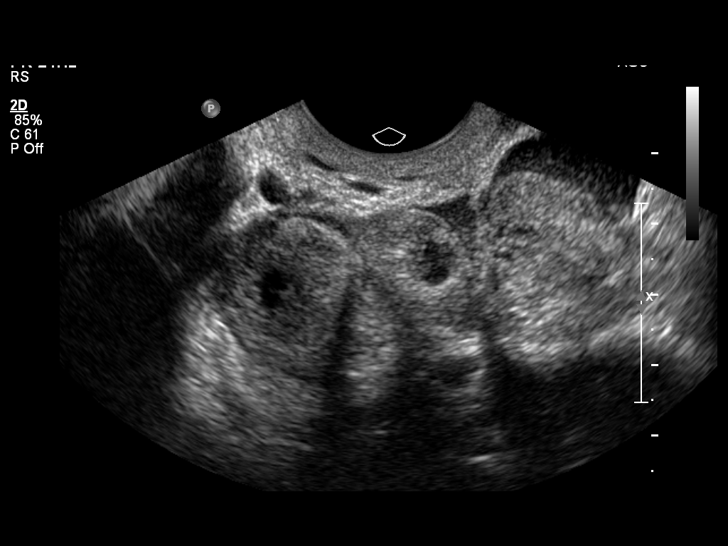
[im 47/58]
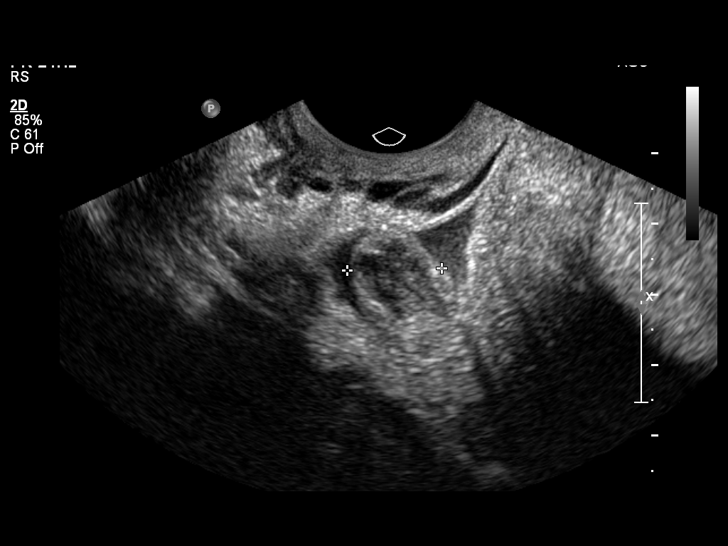
[im 51/58]
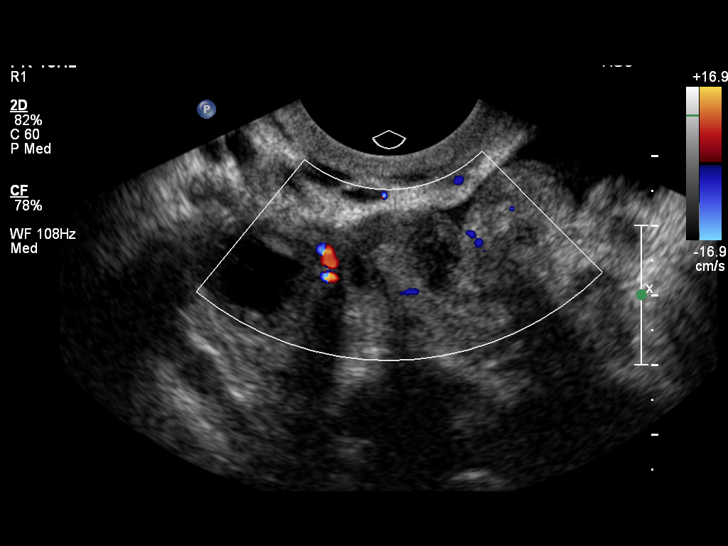
[im 55/58]
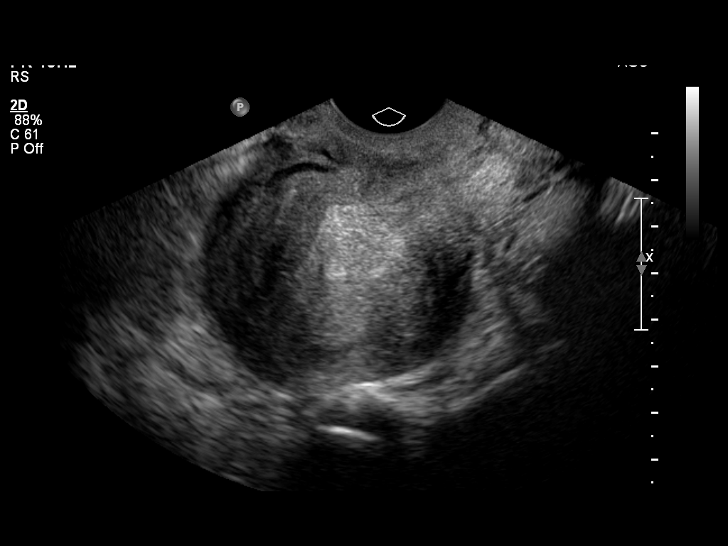

[13 of 28 positions shown; findings below may reference images not displayed]

FINDINGS: Thickened, mildly heterogeneous endometrium, measuring
18.4 mm in maximum thickness transvaginally.  The previously seen
small uterine fibroid was not visualized today.  No intrauterine
gestational sac seen.

Normal appearing maternal left ovary containing small follicles.
Normal appearing right ovary containing a corpus luteum cyst.
x 1.4 x 1.3 cm right adnexal echogenic and hypoechoic mass-like
structure.  This did not peristalsis at real time.

Small amount of hypoechoic free peritoneal fluid.
IMPRESSION: 1.  1.5 cm right adnexal mass-like structure, concerning for the
possibility of an ectopic pregnancy. Follow-up serial quantitative
beta HCG determinations and ultrasound, if clinically indicated
based on the beta HCG results, is recommended.
2.  No intrauterine gestation seen.  This does not exclude the
possibility of a normal early intrauterine pregnancy.  Again,
follow-up with serial quantitative beta HCG determinations and
ultrasound, if clinically based on the beta HCG results, is
recommended.
3.  Small amount of mildly complex free peritoneal fluid.  This
could represent blood associated with a ruptured ectopic pregnancy.

## 2012-03-31 IMAGING — US US TRANSVAGINAL NON-OB
1 series · 14 of 25 positions shown · non-contrast
Comparison: None.

CLINICAL DATA: Vaginal bleeding.



[Series 1: us transvaginal non-ob · 60 acquisitions, 14 frames shown]
[im 1/60]
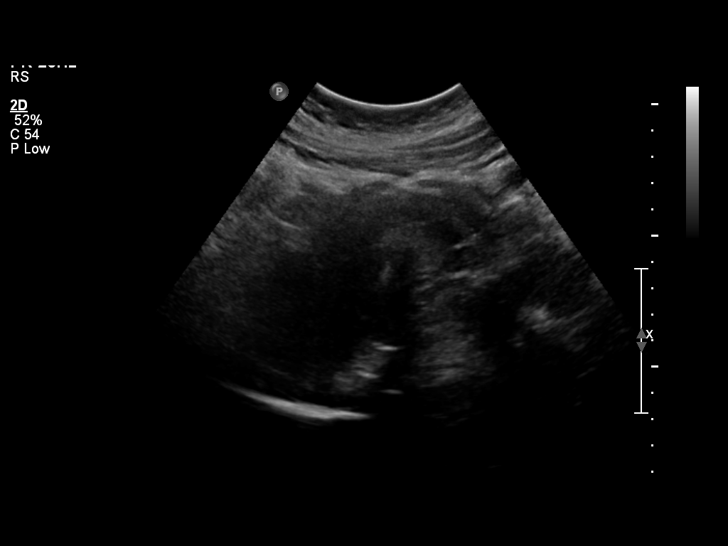
[im 5/60]
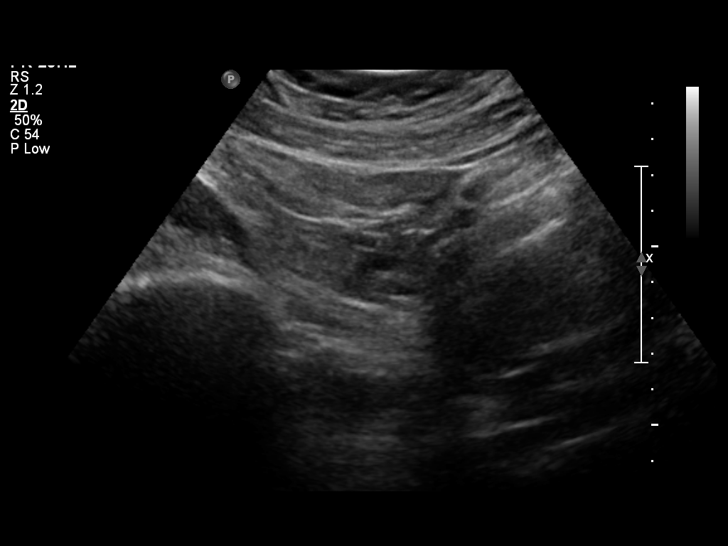
[im 10/60]
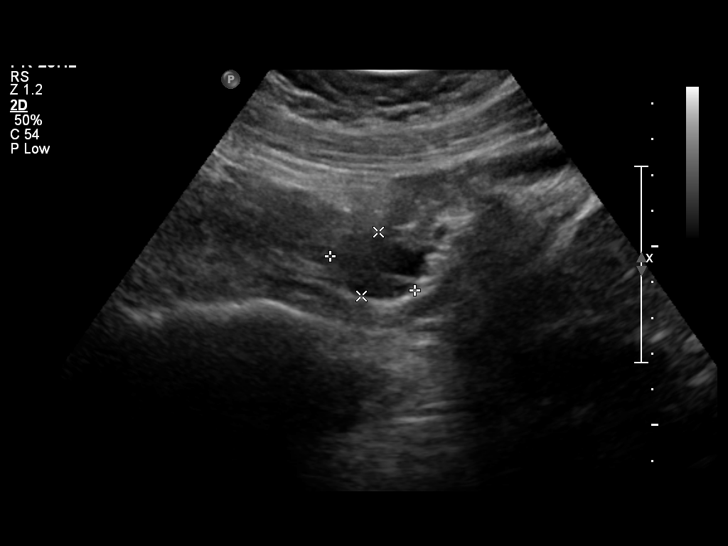
[im 15/60]
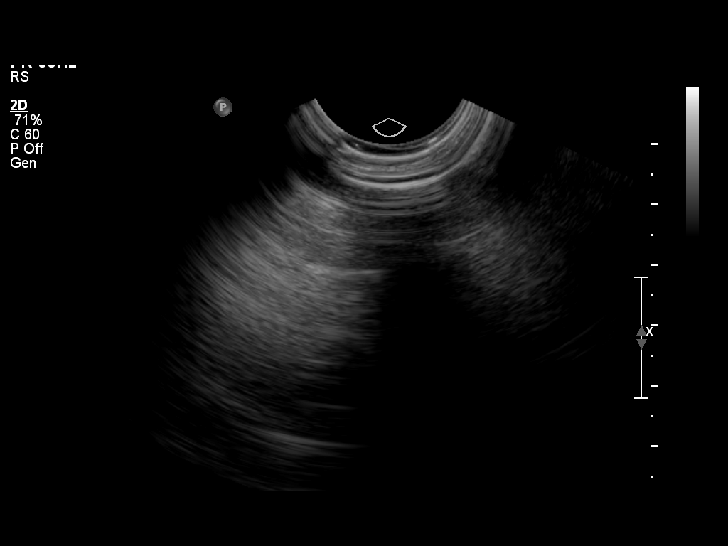
[im 20/60]
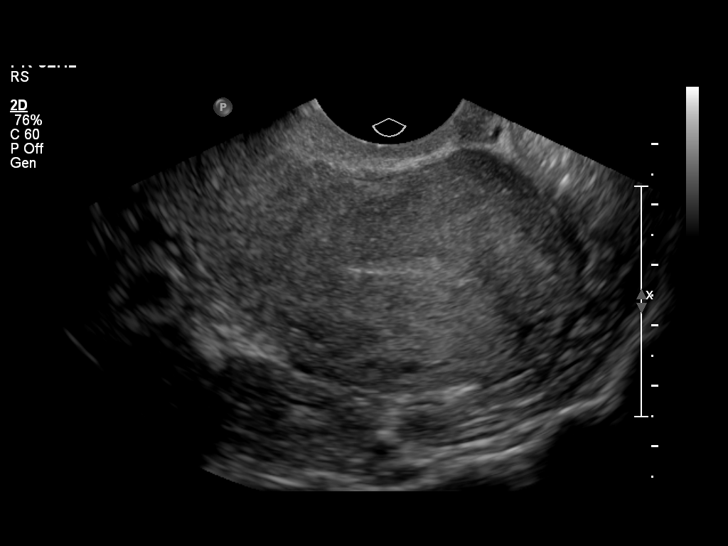
[im 23/60]
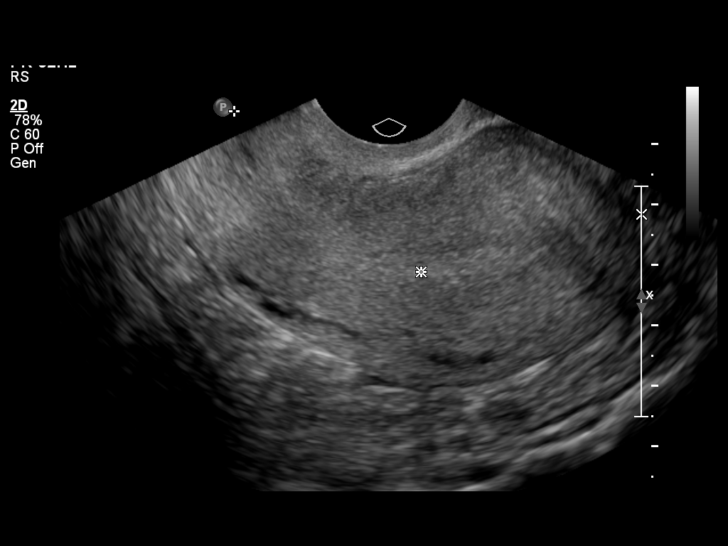
[im 28/60]
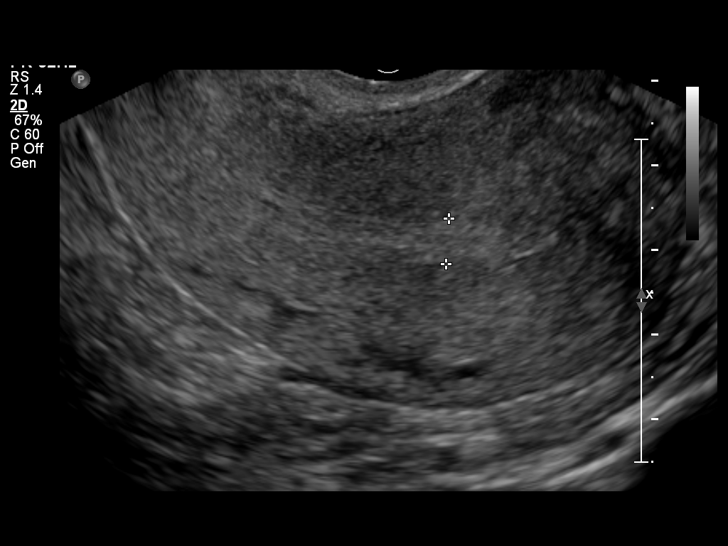
[im 32/60]
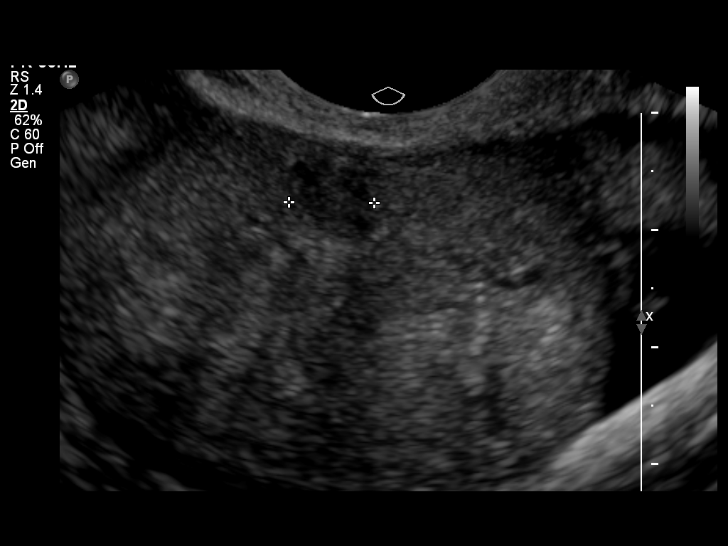
[im 37/60]
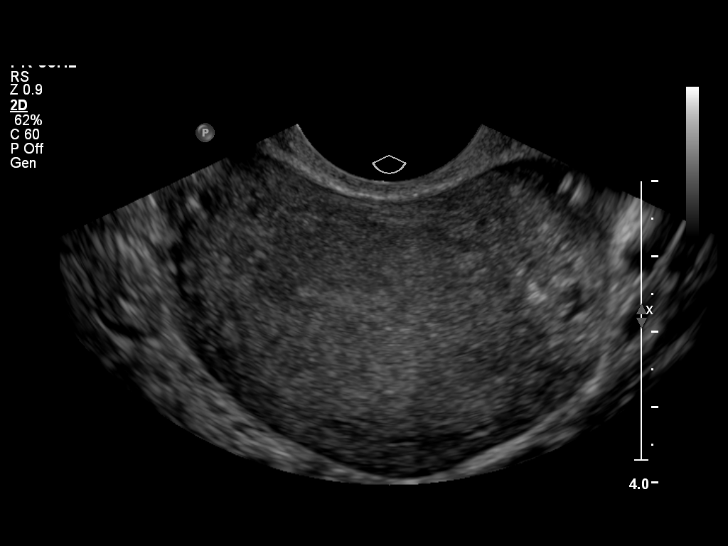
[im 40/60]
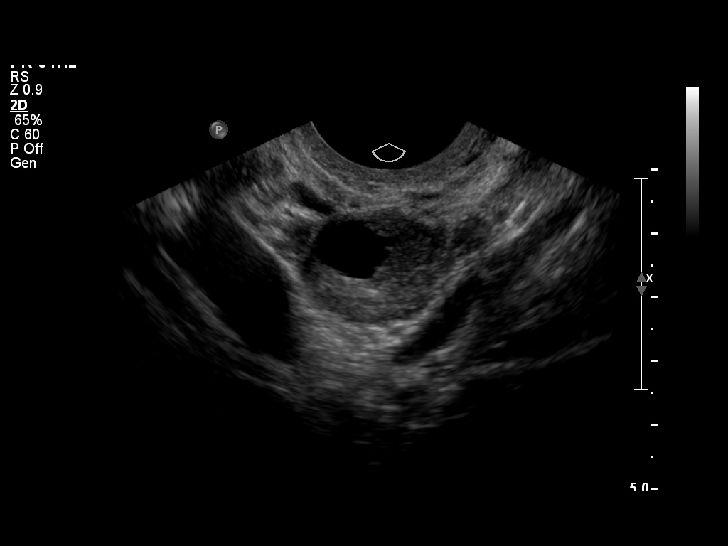
[im 45/60]
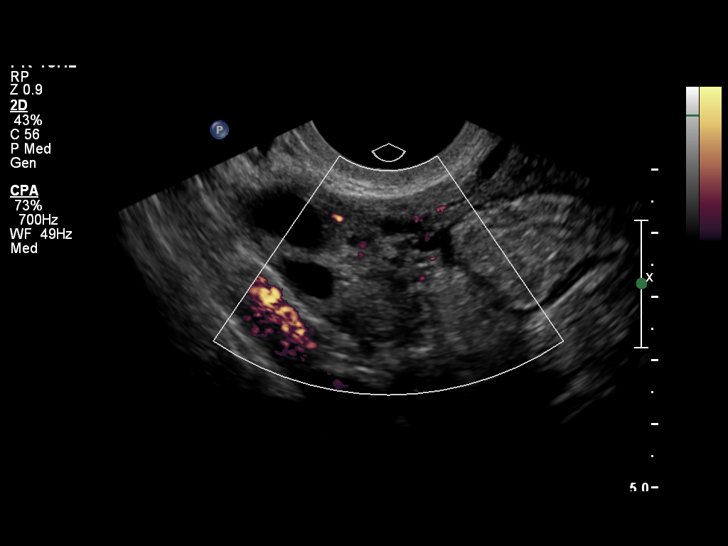
[im 50/60]
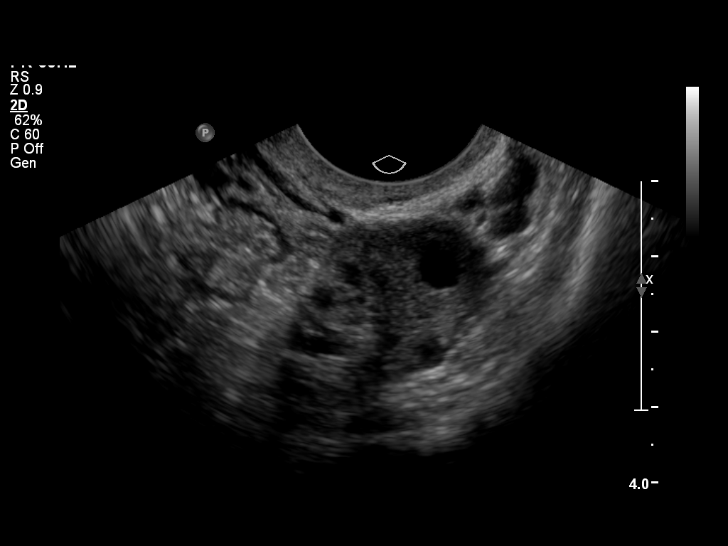
[im 55/60]
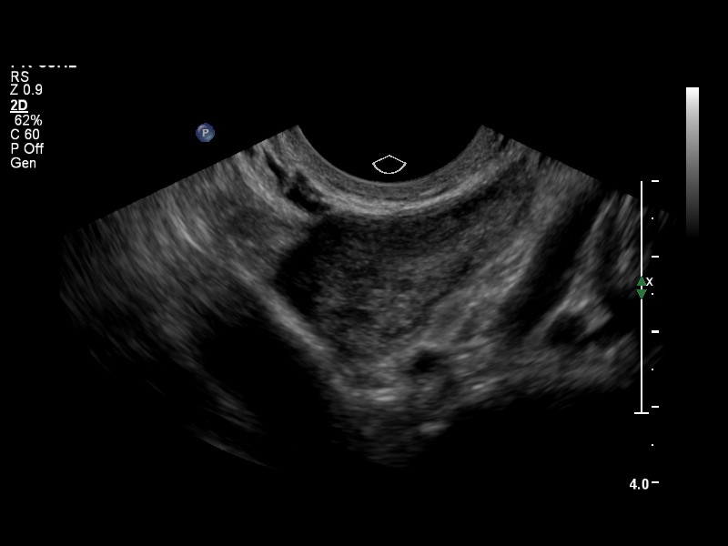
[im 60/60]
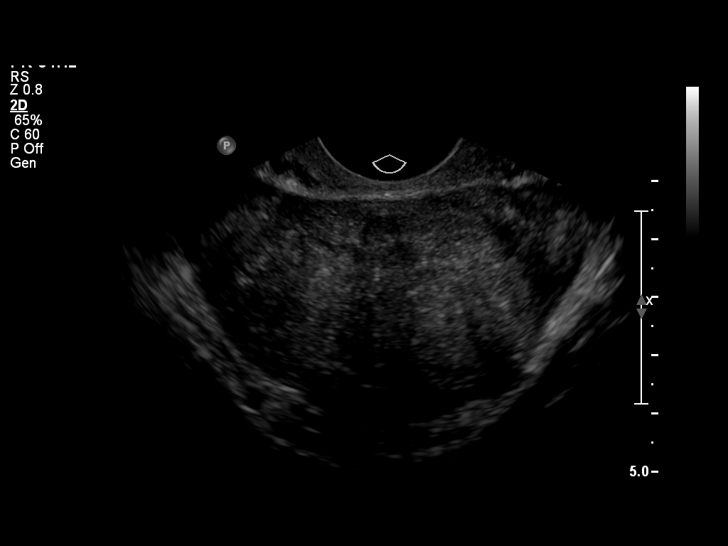

[14 of 25 positions shown; findings below may reference images not displayed]

FINDINGS: Uterus:  Measures 7.8 x 4.3 x 6.1 cm.  A single hypoechoic lesion
measuring 0.8 and 0.8 x 0.7 cm consistent with a fibroid is seen in
the anterior aspect of the fundus.

Endometrium:  Measures 0.5 cm.

Right ovary:  Measures 2.5 x 1.9 x 2.1 cm.  Normal appearance/no
adnexal mass.

Left ovary:  Measures 2.8 x 1.9 x 1.9 cm.  Normal appearance/no
adnexal mass.

Other findings:  No free fluid.
IMPRESSION: No acute finding.  Single 0.8 cm fibroid in the anterior fundus of
the uterus noted.

## 2012-10-01 ENCOUNTER — Encounter: Payer: Self-pay | Admitting: Internal Medicine

## 2012-10-01 ENCOUNTER — Ambulatory Visit (INDEPENDENT_AMBULATORY_CARE_PROVIDER_SITE_OTHER): Payer: Medicaid Other | Admitting: Internal Medicine

## 2012-10-01 VITALS — BP 108/76 | HR 79 | Temp 98.3°F | Ht 65.0 in | Wt 174.1 lb

## 2012-10-01 DIAGNOSIS — F418 Other specified anxiety disorders: Secondary | ICD-10-CM

## 2012-10-01 DIAGNOSIS — G8929 Other chronic pain: Secondary | ICD-10-CM

## 2012-10-01 DIAGNOSIS — M26629 Arthralgia of temporomandibular joint, unspecified side: Secondary | ICD-10-CM

## 2012-10-01 HISTORY — DX: Arthralgia of temporomandibular joint, unspecified side: M26.629

## 2012-10-01 HISTORY — DX: Other specified anxiety disorders: F41.8

## 2012-10-01 HISTORY — DX: Other chronic pain: G89.29

## 2012-10-01 LAB — COMPLETE METABOLIC PANEL WITH GFR
AST: 16 U/L (ref 0–37)
Albumin: 3.9 g/dL (ref 3.5–5.2)
Alkaline Phosphatase: 51 U/L (ref 39–117)
BUN: 10 mg/dL (ref 6–23)
Potassium: 4.2 mEq/L (ref 3.5–5.3)
Sodium: 136 mEq/L (ref 135–145)
Total Bilirubin: 0.3 mg/dL (ref 0.3–1.2)
Total Protein: 6.9 g/dL (ref 6.0–8.3)

## 2012-10-01 LAB — CBC WITH DIFFERENTIAL/PLATELET
Basophils Absolute: 0.1 10*3/uL (ref 0.0–0.1)
Basophils Relative: 1 % (ref 0–1)
Eosinophils Absolute: 0.5 10*3/uL (ref 0.0–0.7)
MCHC: 34.5 g/dL (ref 30.0–36.0)
Neutro Abs: 3.9 10*3/uL (ref 1.7–7.7)
Neutrophils Relative %: 56 % (ref 43–77)
RDW: 15.4 % (ref 11.5–15.5)

## 2012-10-01 LAB — TSH: TSH: 2.5 u[IU]/mL (ref 0.350–4.500)

## 2012-10-01 NOTE — Assessment & Plan Note (Signed)
Patient has most likely situational panic attacks with some underlying anxiety. At this point I am not comfortable to make a definite diagnosis and will refer patient to psychiatry to further evaluate and manage. Nevertheless I will obtain basic labs including CBC, cemented, TSH. Patient is currently not smoking.

## 2012-10-01 NOTE — Assessment & Plan Note (Signed)
I discussed with her exercise on a regular basis and possible physical therapy. She would like to defer to next office visit.

## 2012-10-01 NOTE — Progress Notes (Signed)
Subjective:   Patient ID: Susan Brewer female   DOB: 11-06-1975 37 y.o.   MRN: [id number]  HPI: [name] is a 37 y.o. female with past medical history significant as outlined below who presented to the clinic for evaluation of anxiety. 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 Patient reports she had one time a panic attack years ago and since then she is somewhat a nervous  person but is able to manage usually in her daily life without any problems. The school asked her to be seen by a physician for anxiety past to fill out a form so she would be provided more time to study. Patient reports that she is otherwise doing great except that she noticed since she has started a diet and limiting her calories to 1300 she would experience episodes of shakiness, irritability and she has to eat immediately something. Patient reports that her mother was diagnosed with hypoglycemia back in [place].  Patient further noted that she has been experiencing neck pain and sees a chiropractor on a regular basis. She did exercise including jogging and soma in the past but since her neck pain has been worsening she has not been doing any exercise recently. She was has a treadmill at home which she does not use.  Patient noted that she had a Pap smear 2 years ago which was done by her gynecologist. It is always normal and she has to undergo colposcopy which then was reported to be normal. She wants to make another appointment for the Pap smear appear  Patient reported that she had received tetanus NT Vaccination just prior to entering to nursing school in October and November. It was recurrent.   Past Medical History  Diagnosis Date  . Asthma   . Anxiety    Current Outpatient Prescriptions  Medication Sig Dispense Refill  . ALBUTEROL IN Inhale 1 puff into the lungs daily as needed. As needed for shortness of breath        . ibuprofen (ADVIL,MOTRIN) 200 MG tablet Take 600 mg by mouth every 6 (six) hours as needed. Take as needed for muscle tension, pain        No current facility-administered medications for this visit.   Family History  Problem Relation Age of Onset  . Asthma Son   . Cancer Maternal Aunt     Uterine cancer   . Cancer Maternal Grandmother     Lymphoma    History   Social History  . Marital Status: Single    Spouse Name: N/A    Number of Children: N/A  . Years of Education: N/A   Social History Main Topics  . Smoking status: Former Smoker    Types: Cigarettes    Quit date: 03/26/2011  . Smokeless tobacco: Never Used  . Alcohol Use: 1.2 oz/week    2 Glasses of wine per week  . Drug Use: No  . Sexually Active: Yes    Birth Control/ Protection: None   Other Topics Concern  . Not on file   Social History Narrative  . No narrative on file   Review of Systems: Constitutional: Denies fever, chills, diaphoresis, appetite change and fatigue.  HEENT: Denies photophobia, eye pain, redness, hearing loss, ear pain, congestion, sore throat, rhinorrhea, trouble swallowing, neck pain, neck stiffness and tinnitus.   Respiratory: Denies SOB, DOE, cough, chest tightness,  and wheezing.  Occasionally wheezing with exercise.  Cardiovascular: Denies chest pain, palpitations and leg swelling.  Gastrointestinal: Denies nausea, vomiting, abdominal pain, diarrhea, constipation, blood in stool and abdominal distention.  Genitourinary: Denies dysuria, urgency, frequency, hematuria, flank pain and difficulty urinating.  Skin: Denies pallor, rash and wound.  Neurological: Denies dizziness, seizures, syncope, weakness, light-headedness, numbness and headaches.  Hematological: Denies adenopathy. Easy bruising, personal or family bleeding history  Psychiatric/Behavioral: Denies suicidal ideation, mood changes, confusion,sleep disturbance   Objective:  Physical Exam: Filed Vitals:   10/01/12 0847   BP: 108/76  Pulse: 79  Temp: 98.3 F (36.8 C)  TempSrc: Oral  Height: 5\' 5"  (1.651 m)  Weight: 174 lb 1.6 oz (78.971 kg)  SpO2: 98%   Constitutional: Vital signs reviewed.  Patient is a well-developed and well-nourished female in no acute distress and cooperative with exam. Alert and oriented x3.  Ear: TM normal bilaterally Mouth: no erythema or exudates, MMM Eyes: PERRL, EOMI, conjunctivae normal, No scleral icterus.  Neck: Supple , mild tenderness to palpation  Cardiovascular: RRR, S1 normal, S2 normal, no MRG, pulses symmetric and intact bilaterally Pulmonary/Chest: CTAB, no wheezes, rales, or rhonchi Abdominal: Soft. Non-tender, non-distended, bowel sounds are normal,  Hematology: no cervical adenopathy.  Neurological: A&O x3, Strength is normal and symmetric bilaterally no focal motor deficit, sensory intact to light touch bilaterally.  Marland Kitchen Psychiatric: Normal mood and affect. speech and behavior is normal. Judgment and thought content normal.

## 2012-10-02 ENCOUNTER — Encounter: Payer: Self-pay | Admitting: Licensed Clinical Social Worker

## 2012-10-02 NOTE — Progress Notes (Signed)
[name] was referred to CSW today for psychiatry referrals.  CSW met with [name] following scheduled [facility] appt.  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  [employer] provided [name] with forms that will need to be completed for special testing accommodations.  Pt reports increased sweating, stuffiness and difficulty paying attention during test situations.  CSW offered pt information on both [facility] and [facility].  Pt requesting CSW to contact [employer] for appt and signed ROI.  CSW placed call to [facility], all therapist are Graduate students preparing for licensure.  Clinic is familiar with documentation needed to complete pt's paperwork.  Pt will need learning disability testing, insurance does not cover this but clinic utilizes sliding fee scale.  CSW placed [name] on [facility] waitlist, currently about 3 weeks out.  Letter mailed to pt with information, pt is in clinicals today, Thursday. CSW will follow up with telephone call.

## 2012-11-09 ENCOUNTER — Emergency Department (HOSPITAL_BASED_OUTPATIENT_CLINIC_OR_DEPARTMENT_OTHER)
Admission: EM | Admit: 2012-11-09 | Discharge: 2012-11-09 | Disposition: A | Discharge status: Home / Self Care | Payer: Medicaid Other | Attending: Emergency Medicine | Admitting: Emergency Medicine

## 2012-11-09 ENCOUNTER — Encounter (HOSPITAL_BASED_OUTPATIENT_CLINIC_OR_DEPARTMENT_OTHER): Payer: Self-pay | Admitting: Emergency Medicine

## 2012-11-09 DIAGNOSIS — G8929 Other chronic pain: Secondary | ICD-10-CM | POA: Insufficient documentation

## 2012-11-09 DIAGNOSIS — Z87891 Personal history of nicotine dependence: Secondary | ICD-10-CM | POA: Insufficient documentation

## 2012-11-09 DIAGNOSIS — R259 Unspecified abnormal involuntary movements: Secondary | ICD-10-CM | POA: Insufficient documentation

## 2012-11-09 DIAGNOSIS — R358 Other polyuria: Secondary | ICD-10-CM

## 2012-11-09 DIAGNOSIS — Z8742 Personal history of other diseases of the female genital tract: Secondary | ICD-10-CM | POA: Insufficient documentation

## 2012-11-09 DIAGNOSIS — R3589 Other polyuria: Secondary | ICD-10-CM | POA: Insufficient documentation

## 2012-11-09 DIAGNOSIS — F411 Generalized anxiety disorder: Secondary | ICD-10-CM | POA: Insufficient documentation

## 2012-11-09 DIAGNOSIS — M542 Cervicalgia: Secondary | ICD-10-CM | POA: Insufficient documentation

## 2012-11-09 DIAGNOSIS — R450 Nervousness: Secondary | ICD-10-CM | POA: Insufficient documentation

## 2012-11-09 DIAGNOSIS — J45909 Unspecified asthma, uncomplicated: Secondary | ICD-10-CM | POA: Insufficient documentation

## 2012-11-09 DIAGNOSIS — Z3202 Encounter for pregnancy test, result negative: Secondary | ICD-10-CM | POA: Insufficient documentation

## 2012-11-09 DIAGNOSIS — R42 Dizziness and giddiness: Secondary | ICD-10-CM

## 2012-11-09 DIAGNOSIS — M26629 Arthralgia of temporomandibular joint, unspecified side: Secondary | ICD-10-CM | POA: Insufficient documentation

## 2012-11-09 DIAGNOSIS — Z79899 Other long term (current) drug therapy: Secondary | ICD-10-CM | POA: Insufficient documentation

## 2012-11-09 HISTORY — DX: Benign neoplasm of connective and other soft tissue, unspecified: D21.9

## 2012-11-09 LAB — URINALYSIS, ROUTINE W REFLEX MICROSCOPIC
Bilirubin Urine: NEGATIVE
Nitrite: NEGATIVE
Specific Gravity, Urine: 1.022 (ref 1.005–1.030)
Urobilinogen, UA: 0.2 mg/dL (ref 0.0–1.0)
pH: 6.5 (ref 5.0–8.0)

## 2012-11-09 LAB — URINE MICROSCOPIC-ADD ON

## 2012-11-09 LAB — GLUCOSE, CAPILLARY: Glucose-Capillary: 89 mg/dL (ref 70–99)

## 2012-11-09 LAB — PREGNANCY, URINE: Preg Test, Ur: NEGATIVE

## 2012-11-09 MED ORDER — CIPROFLOXACIN HCL 500 MG PO TABS: 500.0000 mg | ORAL_TABLET | Freq: Two times a day (BID) | ORAL | Status: DC

## 2012-11-09 NOTE — ED Provider Notes (Signed)
History     CSN: [id number]  Arrival date & time 11/09/12  0919   First MD Initiated Contact with Patient 11/09/12 0935      Chief Complaint  Patient presents with  . Dysuria  . Shaking     The history is provided by the patient.  polyuria Onset - yesterday Course - worsening Improved by - nothing Worsened by - nothing  Pt reports polyuria since yesterday.  No dysuria.  No vag bleeding/discharge.  No fever.  No vomiting No abd pain.  No weakness.  She reports anxiety and mild dizziness - she thinks she might have diabetes.  She reports recent heavy vaginal bleeding from periods but that has resolved.   No current abd pain  Past Medical History  Diagnosis Date  . Anxiety   . Asthma     No PFT in records   . 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 Neck pain, chronic 10/01/2012  . Fibroids     Past Surgical History  Procedure Laterality Date  . No past surgeries      Family History  Problem Relation Age of Onset  . Asthma Son   . Cancer Maternal Aunt     Uterine cancer   . Cancer Maternal Grandmother     Lymphoma     History  Substance Use Topics  . Smoking status: Former Smoker    Types: Cigarettes    Quit date: 03/26/2011  . Smokeless tobacco: Never Used  . Alcohol Use: 1.2 oz/week    2 Glasses of wine per week    OB History   Grav Para Term Preterm Abortions TAB SAB Ect Mult Living   2 1 1  1   1  1       Review of Systems  Constitutional: Negative for fever.  Cardiovascular: Negative for chest pain.  Gastrointestinal: Negative for abdominal pain.  Endocrine: Positive for polyuria.  Genitourinary: Negative for dysuria.  Neurological: Positive for dizziness.  Psychiatric/Behavioral: The patient is nervous/anxious.     Allergies  Penicillins  Home Medications   Current Outpatient Rx  Name  Route  Sig  Dispense  Refill  . ALBUTEROL IN   Inhalation   Inhale 1 puff into the lungs daily as needed. As needed for shortness of breath          .  ciprofloxacin (CIPRO) 500 MG tablet   Oral   Take 1 tablet (500 mg total) by mouth 2 (two) times daily.   6 tablet   0   . ibuprofen (ADVIL,MOTRIN) 200 MG tablet   Oral   Take 600 mg by mouth every 6 (six) hours as needed. Take as needed for muscle tension, pain            BP 127/77  Pulse 64  Temp(Src) 97.9 F (36.6 C) (Oral)  Resp 18  Ht 5\' 5"  (1.651 m)  Wt 170 lb (77.111 kg)  BMI 28.29 kg/m2  SpO2 98%  LMP 11/06/2012  Physical Exam CONSTITUTIONAL: Well developed/well nourished HEAD: Normocephalic/atraumatic EYES: EOMI/PERRL ENMT: Mucous membranes moist, conjunctiva pink NECK: supple no meningeal signs SPINE:entire spine nontender CV: S1/S2 noted, no murmurs/rubs/gallops noted LUNGS: Lungs are clear to auscultation bilaterally, no apparent distress ABDOMEN: soft, nontender, no rebound or guarding GU:no cva tenderness NEURO: Pt is awake/alert, moves all extremitiesx4, gait normal EXTREMITIES: pulses normal, full ROM SKIN: warm, color normal PSYCH: no abnormalities of mood noted  ED Course  Procedures (including critical care time)  Labs Reviewed  URINALYSIS, ROUTINE  W REFLEX MICROSCOPIC - Abnormal; Notable for the following:    APPearance CLOUDY (*)    Leukocytes, UA SMALL (*)    All other components within normal limits  URINE MICROSCOPIC-ADD ON - Abnormal; Notable for the following:    Squamous Epithelial / LPF MANY (*)    Bacteria, UA MANY (*)    All other components within normal limits  URINE CULTURE  PREGNANCY, URINE  GLUCOSE, CAPILLARY   No results found.   1. Polyuria   2. Dizziness     Will tx for uti   Pt is well appearing.  Stable for d/c   MDM  Nursing notes including past medical history and social history reviewed and considered in documentation Labs/vital reviewed and considered         Joya Gaskins, MD 11/09/12 1103

## 2012-11-09 NOTE — ED Notes (Signed)
Pt states she had heavy period this past week.  Pt states she had heavy clots.  Recently has been having issues with feeling shaky, sweating and headaches.  Some nausea, and sob.  Pt states she has been having some anxiety due to health and school.

## 2012-11-11 LAB — URINE CULTURE
Colony Count: NO GROWTH
Culture: NO GROWTH

## 2013-02-19 ENCOUNTER — Ambulatory Visit (INDEPENDENT_AMBULATORY_CARE_PROVIDER_SITE_OTHER): Payer: Medicaid Other | Admitting: Internal Medicine

## 2013-02-19 ENCOUNTER — Other Ambulatory Visit (HOSPITAL_COMMUNITY)
Admission: RE | Admit: 2013-02-19 | Discharge: 2013-02-19 | Disposition: A | Discharge status: Home / Self Care | Payer: Medicaid Other | Source: Ambulatory Visit | Attending: Internal Medicine | Admitting: Internal Medicine

## 2013-02-19 ENCOUNTER — Encounter: Payer: Self-pay | Admitting: Internal Medicine

## 2013-02-19 VITALS — BP 100/70 | HR 79 | Temp 97.9°F | Ht 65.0 in | Wt 180.4 lb

## 2013-02-19 DIAGNOSIS — Z01419 Encounter for gynecological examination (general) (routine) without abnormal findings: Secondary | ICD-10-CM | POA: Insufficient documentation

## 2013-02-19 DIAGNOSIS — Z124 Encounter for screening for malignant neoplasm of cervix: Secondary | ICD-10-CM

## 2013-02-19 DIAGNOSIS — Z1151 Encounter for screening for human papillomavirus (HPV): Secondary | ICD-10-CM | POA: Insufficient documentation

## 2013-02-19 DIAGNOSIS — F411 Generalized anxiety disorder: Secondary | ICD-10-CM

## 2013-02-19 DIAGNOSIS — B977 Papillomavirus as the cause of diseases classified elsewhere: Secondary | ICD-10-CM | POA: Insufficient documentation

## 2013-02-19 NOTE — Assessment & Plan Note (Signed)
Did not follow up with psychiatry.  Recommended to follow up with them again and just touch base incase anxiety comes again, especially with exams.  Agree with Dr. Loistine Chance that seems more situation.  Currently stable.  Does not wish to take any medication or any counseling at this time.  Also endorses feeling hypoglycemic at times although CBGs wnl, she says usually >80.  Recommended her to check sugars with meter once a day and during those episodes and bring to clinic for review by pcp

## 2013-02-19 NOTE — Progress Notes (Signed)
Subjective:   Patient ID: Susan Brewer female   DOB: 06-Sep-1975 37 y.o.   MRN: [id number]  HPI: Susan Brewer is a 37 y.o. female with PMH of anxiety, HPV, ectopic pregnancy, and fibroids per patient presenting to [facility] today for a routine papsmear.  She says it has been 2 years since her last pap and that her pap smears are usually abnormal and has had one colposcopy before that was okay.  She does claim she was diagnosed with HPV several years ago and used to be followed by Susan Brewer at [facility] center until she decided to stop going there and tried a different office but they do not take medicaid.  She reports having an ectopic pregnancy in the past while she was a patient there.  We discussed the need for her to follow up with obgyn regularly given her history and she agrees and [facility]    Susan Brewer also endorses feeling hypoglycemic at times, especially when she is anxious. However, she says when she checks her sugars it is usually >80.  She says her mother was also diagnosed with hypoglycemia several years ago but was able to make some dietary changes and is doing well.  She says it does not happen all the time and as soon as she eats something she feels better.  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  She makes sure to eat prior to working out.  She has a glucometer at home but has run out of strips and has ordered new ones.  We discussed her checking her blood sugar regularly, at least once a day and anytime she feels that way and to bring in her meter on her next visit.  She was also referred to [facility] psychiatry by social work on her last opc visit and was added to the wait list.  She says she was called from the waitlist but she did not wish to be seen anymore since her anxiety had resolved and exams were over.  I encouraged her to touch base  with them again so that if her anxiety flares again she can be seen and evaluated by them right away and she said she would think about it.  She thinks things are okay for now and does not wish to have any further treatment or medication or counseling at this time.  She does not like taking medication unless necessary.  CBG today 80s.    Past Medical History  Diagnosis Date  . Anxiety   . Asthma     No PFT in records   . 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 Neck pain, chronic 10/01/2012  . Fibroids    Current Outpatient Prescriptions  Medication Sig Dispense Refill  . ALBUTEROL IN Inhale 1 puff into the lungs daily as needed. As needed for shortness of breath       . ciprofloxacin (CIPRO) 500 MG tablet Take 1 tablet (500 mg total) by mouth 2 (two) times daily.  6 tablet  0  . ibuprofen (ADVIL,MOTRIN) 200 MG tablet Take 600 mg by mouth every 6 (six) hours as needed. Take as needed for muscle tension, pain        No current facility-administered medications for this visit.   Family History  Problem Relation Age of Onset  . Asthma Son   .  Cancer Maternal Aunt     Uterine cancer   . Cancer Maternal Grandmother     Lymphoma    History   Social History  . Marital Status: Single    Spouse Name: N/A    Number of Children: N/A  . Years of Education: N/A   Social History Main Topics  . Smoking status: Former Smoker    Types: Cigarettes    Quit date: 03/26/2011  . Smokeless tobacco: Never Used  . Alcohol Use: 1.2 oz/week    2 Glasses of wine per week  . Drug Use: No  . Sexually Active: Yes    Birth Control/ Protection: None   Other Topics Concern  . None   Social History Narrative  . None   Review of Systems:  Constitutional:  Denies fever, chills, diaphoresis, appetite change and fatigue.   HEENT:  Denies congestion, sore throat, rhinorrhea, sneezing, mouth sores, trouble swallowing, neck pain   Respiratory:  Denies SOB, DOE, cough, and wheezing.   Cardiovascular:  Denies  palpitations and leg swelling.   Gastrointestinal:  Denies nausea, vomiting, abdominal pain, diarrhea, constipation, blood in stool and abdominal distention.   Genitourinary:  Denies dysuria, urgency, frequency, hematuria, flank pain and difficulty urinating.   Musculoskeletal:  Denies myalgias, back pain, joint swelling, arthralgias and gait problem.   Skin:  Denies pallor, rash and wound.   Neurological:  Anxious at times.  Denies dizziness, seizures, syncope, weakness, light-headedness, numbness and headaches.    Objective:  Physical Exam: Filed Vitals:   02/19/13 1029  BP: 100/70  Pulse: 79  Temp: 97.9 F (36.6 C)  TempSrc: Oral  Height: 5\' 5"  (1.651 m)  Weight: 180 lb 6.4 oz (81.829 kg)  SpO2: 100%   Vitals reviewed. General: sitting in chair, NAD HEENT: PERRL, EOMI, no scleral icterus Cardiac: RRR, no rubs, murmurs or gallops Pulm: clear to auscultation bilaterally, no wheezes, rales, or rhonchi Abd: soft, nontender, nondistended, BS present GYN: no visible masses, pink cervix with bleeding after scrape, ?enlarged uterus on manual exam, no tenderness elicited.  Light blood on gloves after manual exam. No discharge.  Ext: warm and well perfused, no pedal edema, +2DP B/L Neuro: alert and oriented X3, cranial nerves II-XII grossly intact, strength and sensation to light touch equal in bilateral upper and lower extremities  Assessment & Plan:  Discussed with Susan Brewer  Refer to OBGYN  papsmear done today

## 2013-02-19 NOTE — Assessment & Plan Note (Signed)
Claims to have HPV diagnosed in past and abnormal pap smears.    -pap smear done today -[facility] with her [facility] -obtain records from [facility] clinic Dr. Clearance Coots to be reviewed by pcp

## 2013-02-19 NOTE — Patient Instructions (Addendum)
Please follow up with GYN and we will let you know results of your pap smear  Check your blood sugars daily and when you feel like its low and bring your meter to your next visit  Please do call the [facility] psychiatrist service to get plugged in with them incase your anxiety comes up again  Keep doing well in school, it was nice meeting you.  Good luck!

## 2013-02-20 LAB — GLUCOSE, CAPILLARY: Glucose-Capillary: 92 mg/dL (ref 70–99)

## 2013-02-20 NOTE — Progress Notes (Signed)
Case discussed with Dr. Virgina Organ at the time of the visit.  I personally supervised the PAP smear procedure and pelvic exam. We reviewed the resident's history and exam and pertinent patient test results.  I agree with the assessment, diagnosis, and plan of care documented in the resident's note.

## 2013-03-03 ENCOUNTER — Telehealth: Payer: Self-pay

## 2013-04-02 ENCOUNTER — Encounter: Payer: Self-pay | Admitting: Obstetrics & Gynecology

## 2013-04-20 ENCOUNTER — Ambulatory Visit: Payer: Medicaid Other

## 2013-04-24 ENCOUNTER — Ambulatory Visit (INDEPENDENT_AMBULATORY_CARE_PROVIDER_SITE_OTHER): Payer: Medicaid Other | Admitting: *Deleted

## 2013-04-24 DIAGNOSIS — Z23 Encounter for immunization: Secondary | ICD-10-CM

## 2013-05-14 ENCOUNTER — Encounter: Payer: Medicaid Other | Admitting: Obstetrics & Gynecology

## 2013-05-15 NOTE — Telephone Encounter (Signed)
error 

## 2013-06-04 ENCOUNTER — Telehealth: Payer: Self-pay

## 2013-06-29 ENCOUNTER — Emergency Department (HOSPITAL_BASED_OUTPATIENT_CLINIC_OR_DEPARTMENT_OTHER)
Admission: EM | Admit: 2013-06-29 | Discharge: 2013-06-29 | Disposition: A | Discharge status: Home / Self Care | Payer: Medicaid Other | Attending: Emergency Medicine | Admitting: Emergency Medicine

## 2013-06-29 ENCOUNTER — Encounter (HOSPITAL_BASED_OUTPATIENT_CLINIC_OR_DEPARTMENT_OTHER): Payer: Self-pay | Admitting: Emergency Medicine

## 2013-06-29 DIAGNOSIS — G8929 Other chronic pain: Secondary | ICD-10-CM | POA: Insufficient documentation

## 2013-06-29 DIAGNOSIS — M25519 Pain in unspecified shoulder: Secondary | ICD-10-CM | POA: Insufficient documentation

## 2013-06-29 DIAGNOSIS — J45909 Unspecified asthma, uncomplicated: Secondary | ICD-10-CM | POA: Insufficient documentation

## 2013-06-29 DIAGNOSIS — Z87891 Personal history of nicotine dependence: Secondary | ICD-10-CM | POA: Insufficient documentation

## 2013-06-29 DIAGNOSIS — F411 Generalized anxiety disorder: Secondary | ICD-10-CM | POA: Insufficient documentation

## 2013-06-29 DIAGNOSIS — Z880 Allergy status to penicillin: Secondary | ICD-10-CM | POA: Insufficient documentation

## 2013-06-29 DIAGNOSIS — Z8719 Personal history of other diseases of the digestive system: Secondary | ICD-10-CM | POA: Insufficient documentation

## 2013-06-29 DIAGNOSIS — M542 Cervicalgia: Secondary | ICD-10-CM | POA: Insufficient documentation

## 2013-06-29 DIAGNOSIS — M7918 Myalgia, other site: Secondary | ICD-10-CM

## 2013-06-29 DIAGNOSIS — Z8742 Personal history of other diseases of the female genital tract: Secondary | ICD-10-CM | POA: Insufficient documentation

## 2013-06-29 DIAGNOSIS — Z79899 Other long term (current) drug therapy: Secondary | ICD-10-CM | POA: Insufficient documentation

## 2013-06-29 MED ORDER — CYCLOBENZAPRINE HCL 5 MG PO TABS: 5.0000 mg | ORAL_TABLET | Freq: Three times a day (TID) | ORAL | Status: DC | PRN

## 2013-06-29 NOTE — ED Provider Notes (Signed)
I saw and evaluated the patient, reviewed the resident's note and I agree with the findings and plan.  EKG Interpretation   None       Patient seen by me. Patient clinically with musculoskeletal pain already followed by a chiropractor. X-rays not indicated CT scans not indicated. Patient has followup. Patient we treated symptomatically. No focal neuro deficits.   Shelda Jakes, MD 06/29/13 [phone]

## 2013-06-29 NOTE — ED Provider Notes (Signed)
CSN: [id number]     Arrival date & time 06/29/13  1352 History   None    Chief Complaint  Patient presents with  . Neck Pain   (Consider location/radiation/quality/duration/timing/severity/associated sxs/prior Treatment) Patient is a 37 y.o. female presenting with neck pain. The history is provided by the patient.  Neck Pain Pain location:  R side Quality:  Aching, shooting and stiffness Stiffness is present:  All day Pain radiates to:  R shoulder Pain severity:  Moderate Pain is:  Worse during the day Onset quality:  Sudden Duration:  5 days Timing:  Intermittent Progression:  Waxing and waning Chronicity: acute on chronic. Context: not fall, not lifting a heavy object, not MCA, not MVA, not pedestrian accident and not recent injury   Context comment:  Developed after recent manipulation at chiropractor Relieved by:  Ice and heat (sleeping) Worsened by:  Position, bending and stress Ineffective treatments:  None tried Associated symptoms: no bladder incontinence, no bowel incontinence, no chest pain, no fever, no headaches, no leg pain, no numbness, no paresis, no photophobia, no syncope, no tingling, no visual change, no weakness and no weight loss     Past Medical History  Diagnosis Date  . Anxiety   . Asthma     No PFT in records   . 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 Neck pain, chronic 10/01/2012  . Fibroids    Past Surgical History  Procedure Laterality Date  . No past surgeries     Family History  Problem Relation Age of Onset  . Asthma Son   . Cancer Maternal Aunt     Uterine cancer   . Cancer Maternal Grandmother     Lymphoma    History  Substance Use Topics  . Smoking status: Former Smoker    Types: Cigarettes    Quit date: 03/26/2011  . Smokeless tobacco: Never Used  . Alcohol Use: 1.2 oz/week    2 Glasses of wine per week   OB History   Grav Para Term Preterm Abortions TAB SAB Ect Mult Living   2 1 1  1   1  1      Review of Systems    Constitutional: Negative for fever and weight loss.  Eyes: Negative for photophobia.  Cardiovascular: Negative for chest pain and syncope.  Gastrointestinal: Negative for bowel incontinence.  Genitourinary: Negative for bladder incontinence.  Musculoskeletal: Positive for neck pain.  Neurological: Negative for tingling, weakness, numbness and headaches.    Allergies  Penicillins  Home Medications   Current Outpatient Rx  Name  Route  Sig  Dispense  Refill  . ALBUTEROL IN   Inhalation   Inhale 1 puff into the lungs daily as needed. As needed for shortness of breath          . ibuprofen (ADVIL,MOTRIN) 200 MG tablet   Oral   Take 600 mg by mouth every 6 (six) hours as needed. Take as needed for muscle tension, pain           BP 112/72  Pulse 62  Temp(Src) 98.6 F (37 C) (Oral)  Resp 18  Ht 5\' 5"  (1.651 m)  Wt 180 lb (81.647 kg)  BMI 29.95 kg/m2  SpO2 100%  LMP 06/21/2013 Physical Exam  Constitutional: She is oriented to person, place, and time. She appears well-developed and well-nourished.  HENT:  Head: Normocephalic and atraumatic.  Eyes: Conjunctivae and EOM are normal. Pupils are equal, round, and reactive to light.  Neck: Normal range of motion. Neck  supple.  Cardiovascular: Normal rate, regular rhythm, normal heart sounds and intact distal pulses.  Exam reveals no gallop and no friction rub.   No murmur heard. Pulmonary/Chest: Effort normal and breath sounds normal. No respiratory distress. She has no wheezes. She has no rales.  Abdominal: Soft. Bowel sounds are normal. She exhibits no distension. There is no tenderness. There is no rebound.  Musculoskeletal:       Cervical back: She exhibits tenderness and pain. She exhibits normal range of motion, no bony tenderness, no swelling, no edema, no deformity and no laceration.       Back:  Tender to palpation along trapezius and above rhomboids. Increased muscle tone. No bony tenderness. No radiation to arm. Arm  strength is 5/5  Neurological: She is alert and oriented to person, place, and time.  Skin: She is not diaphoretic.  Psychiatric:  Anxious women.    ED Course  Procedures (including critical care time) Labs Review Labs Reviewed - No data to display Imaging Review No results found.  EKG Interpretation   None       MDM  No diagnosis found.  1. Myofascial pain The patient appears to have myofascial pain located in the trapezius muscle. This has been a chronic problem that was exacerbated by recent chiropractic manipulation. The patient is very anxious about her pain. I reassured the patient and told her that her pain is most likely due to muscle pain and that imaging would not be likely to yield information that would affect our treatment at this time. I recommended that the aptient try heating pads, OTC NSAIDs and tylenol, massage therapy, and flexeril for this pain. I recommended that the patient be referred to sports medicine. She agreed with this plan.     Pleas Koch, MD 06/29/13 1455

## 2013-06-29 NOTE — ED Notes (Signed)
Has been seeing a chiropractor had adjustment on Wednesday and since then has been having more stiffness in mid back and neck has been using ice packs and neck stretches

## 2013-07-01 ENCOUNTER — Encounter: Payer: Self-pay | Admitting: Family Medicine

## 2013-07-01 ENCOUNTER — Ambulatory Visit (INDEPENDENT_AMBULATORY_CARE_PROVIDER_SITE_OTHER): Payer: Medicaid Other | Admitting: Family Medicine

## 2013-07-01 VITALS — BP 105/71 | HR 76 | Ht 65.0 in | Wt 180.0 lb

## 2013-07-01 DIAGNOSIS — M542 Cervicalgia: Secondary | ICD-10-CM

## 2013-07-01 DIAGNOSIS — G8929 Other chronic pain: Secondary | ICD-10-CM

## 2013-07-01 NOTE — Patient Instructions (Signed)
You have trapezius, paraspinal muscle spasms of your neck. This can be related to an irritated nerve in your neck or simply severe spasms that aren't getting better. Consider prednisone 6 day dose pack to relieve irritation/inflammation of the nerve. Aleve 2 tabs twice a day with food for pain and inflammation. Consider flexeril for spasms, norco for pain. Start physical therapy for stretching, exercises, traction, and modalities. Do home exercises on days you don't go to therapy. Heat 15 minutes at a time 3-4 times a day to help with spasms. Watch head position when on computers, texting, when sleeping in bed - should in line with back to prevent further nerve traction and irritation. If not improving we will consider an MRI. Follow up generally in 6 weeks either here or [place] office. [facility] [phone] - call to consider active release.

## 2013-07-02 ENCOUNTER — Encounter: Payer: Self-pay | Admitting: Family Medicine

## 2013-07-02 NOTE — Assessment & Plan Note (Signed)
consistent with trapezius, paraspinal muscle spasms.  Possible she has a bulging disc to account for this but will start with conservative treatment.  Aleve, formal physical therapy and home exercises.  Consider prednisone, flexeril.  Heat for spasms.  Ergonomic issues discussed.  Consider imaging if not improving over next 6 weeks.  Will consider active release also.
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
## 2013-07-23 ENCOUNTER — Ambulatory Visit: Payer: Medicaid Other | Attending: Family Medicine | Admitting: Physical Therapy

## 2013-08-10 ENCOUNTER — Ambulatory Visit (INDEPENDENT_AMBULATORY_CARE_PROVIDER_SITE_OTHER): Payer: Medicaid Other | Admitting: Obstetrics & Gynecology

## 2013-08-10 ENCOUNTER — Encounter: Payer: Self-pay | Admitting: Obstetrics & Gynecology

## 2013-08-10 VITALS — BP 102/71 | HR 73 | Temp 98.2°F | Ht 65.0 in | Wt 184.0 lb

## 2013-08-10 DIAGNOSIS — D259 Leiomyoma of uterus, unspecified: Secondary | ICD-10-CM

## 2013-08-10 DIAGNOSIS — D219 Benign neoplasm of connective and other soft tissue, unspecified: Secondary | ICD-10-CM

## 2013-08-10 DIAGNOSIS — Z8619 Personal history of other infectious and parasitic diseases: Secondary | ICD-10-CM

## 2013-08-10 NOTE — Patient Instructions (Addendum)
HPV Test The HPV (human papillomavirus) test is used to screen for high-risk types with HPV infection. HPV is a group of about 100 related viruses, of which 40 types are genital viruses. Most HPV viruses cause infections that usually resolve without treatment within 2 years. Some HPV infections can cause skin and genital warts (condylomata). HPV types 16, 18, 31 and 45 are considered high-risk types of HPV. High-risk types of HPV do not usually cause visible warts, but if untreated, may lead to cancers of the outlet of the womb (cervix) or anus. An HPV test identifies the DNA (genetic) strands of the HPV infection. Because the test identifies the DNA strands, the test is also referred to as the HPV DNA test. Although HPV is found in both males and females, the HPV test is only used to screen for cervical cancer in females. This test is recommended for females:  With an abnormal Pap test.  After treatment of an abnormal Pap test.  Aged 17 and older.  After treatment of a high-risk HPV infection. The HPV test may be done at the same time as a Pap test in females over the age of 21. Both the HPV and Pap test require a sample of cells from the cervix. PREPARATION FOR TEST  You may be asked to avoid douching, tampons, or vaginal medicines for 48 hours before the HPV test. You will be asked to urinate before the test. For the HPV test, you will need to lie on an exam table with your feet in stirrups. A spatula will be inserted into the vagina. The spatula will be used to swab the cervix for a cell and mucus sample. The sample will be further evaluated in a lab under a microscope. NORMAL FINDINGS  Normal: High-risk HPV is not found.  Ranges for normal findings may vary among different laboratories and hospitals. You should always check with your doctor after having lab work or other tests done to discuss the meaning of your test results and whether your values are considered within normal limits. MEANING  OF TEST An abnormal HPV test means that high-risk HPV is found. Your caregiver may recommend further testing. Your caregiver will go over the test results with you. He or she will and discuss the importance and meaning of your results, as well as treatment options and the need for additional tests, if necessary. OBTAINING THE RESULTS  It is your responsibility to obtain your test results. Ask the lab or department performing the test when and how you will get your results. Document Released: 08/24/2004 Document Revised: 10/22/2011 Document Reviewed: 05/09/2005 [facility] Patient Information 2014 [place], [place]. Hysterosalpingography Hysterosalpingography is a procedure to look inside your uterus and fallopian tubes. During this procedure, contrast dye is injected into your uterus through your vagina and cervix to illuminate your uterus while X-ray pictures are taken. This procedure may help your health care provider determine whether you have uterine tumors, adhesions, or structural abnormalities. It is commonly used to help determine why a woman is unable to have children (infertility). The procedure usually lasts about 15 30 minutes. LET [facility] CARE PROVIDER KNOW ABOUT:  Any allergies you have.  All medicines you are taking, including vitamins, herbs, eye drops, creams, and over-the-counter medicines.  Previous problems you or members of your family have had with the use of anesthetics.  Any blood disorders you have.  Previous surgeries you have had.  Medical conditions you have. RISKS AND COMPLICATIONS  Generally, this is a safe  procedure. However, as with any procedure, complications can occur. Possible complications include:  Infection in the lining of the uterus (endometritis) or fallopian tubes (salpingitis).  Damage or perforation of the uterus or fallopian tubes.  An allergic reaction to the contrast dye used to perform the X-ray. BEFORE THE PROCEDURE   Schedule the  procedure after your period stops, but before your next ovulation. This is usually between day 5 and day 10 of your last period. Day 1 is the first day of your period.  Ask your health care provider about changing or stopping your regular medicines.  You may eat and drink as normal.  Empty your bladder before the procedure begins. PROCEDURE  You may be given a medicine to relax you (sedative) or an over-the-counter pain medicine to lessen any discomfort during the procedure.  You will lie down on an X-ray table with your feet in stirrups.  A device called a speculum will be placed into your vagina. This allows your health care provider to see inside your vagina to the cervix.  The cervix will be washed with a special soap.  A thin, flexible tube will be passed through the cervix into the uterus.  Contrast dye will be put into this tube.  Several X-rays will be taken as the contrast dye spreads through the uterus and fallopian tubes.  The tube will be taken out after the procedure. AFTER THE PROCEDURE   Most of the contrast dye will flow out of your vagina naturally. You may want to wear a sanitary pad.  You may feel mild cramping.  Ask when your test results will be ready. Make sure you get your test results. Document Released: 09/01/2004 Document Revised: 04/01/2013 Document Reviewed: 01/30/2013 [facility] Patient Information 2014 [place], [place].

## 2013-08-10 NOTE — Progress Notes (Signed)
Subjective:     Patient ID: Susan Brewer, female   DOB: 1976-02-23, 37 y.o.   MRN: [id number]  HPI Pt presents with c/o h/o HPV.  She reports that she needs to be tested.  She was not aware that she was tested in July with her PAP at the [facility].  She reports a h/o an Ectopic pregnancy and is worried that here tubes might be blocked.       Review of Systems     Objective:   Physical Exam BP 102/71  Pulse 73  Temp(Src) 98.2 F (36.8 C)  Ht 5\' 5"  (1.651 m)  Wt 184 lb (83.462 kg)  BMI 30.62 kg/m2  LMP 07/24/2013  Exam deferred    Assessment:     H/o HPV H/o ectopic pregnancy     Plan:     rec HSG in the future to confirm tubal patency due to h/o ectopic pregnancy s/p MTX   F/u in July for PAP and HPV

## 2013-09-07 ENCOUNTER — Encounter: Payer: Self-pay | Admitting: Internal Medicine

## 2013-09-07 ENCOUNTER — Ambulatory Visit (INDEPENDENT_AMBULATORY_CARE_PROVIDER_SITE_OTHER): Payer: Medicaid Other | Admitting: Internal Medicine

## 2013-09-07 VITALS — BP 112/66 | HR 77 | Temp 97.3°F | Ht 65.0 in | Wt 189.1 lb

## 2013-09-07 DIAGNOSIS — F418 Other specified anxiety disorders: Secondary | ICD-10-CM

## 2013-09-07 DIAGNOSIS — Z0000 Encounter for general adult medical examination without abnormal findings: Secondary | ICD-10-CM

## 2013-09-07 DIAGNOSIS — F411 Generalized anxiety disorder: Secondary | ICD-10-CM

## 2013-09-07 DIAGNOSIS — B977 Papillomavirus as the cause of diseases classified elsewhere: Secondary | ICD-10-CM

## 2013-09-07 LAB — COMPLETE METABOLIC PANEL WITH GFR
ALBUMIN: 3.7 g/dL (ref 3.5–5.2)
ALT: 13 U/L (ref 0–35)
AST: 15 U/L (ref 0–37)
Alkaline Phosphatase: 54 U/L (ref 39–117)
BUN: 10 mg/dL (ref 6–23)
CALCIUM: 8.7 mg/dL (ref 8.4–10.5)
CHLORIDE: 104 meq/L (ref 96–112)
CO2: 27 meq/L (ref 19–32)
Creat: 0.74 mg/dL (ref 0.50–1.10)
GFR, Est African American: >89 mL/min
GLUCOSE: 78 mg/dL (ref 70–99)
POTASSIUM: 4.4 meq/L (ref 3.5–5.3)
Sodium: 136 mEq/L (ref 135–145)
Total Bilirubin: 0.3 mg/dL (ref 0.3–1.2)
Total Protein: 6.6 g/dL (ref 6.0–8.3)

## 2013-09-07 LAB — CBC WITH DIFFERENTIAL/PLATELET
BASOS ABS: 0 10*3/uL (ref 0.0–0.1)
Basophils Relative: 1 % (ref 0–1)
EOS PCT: 6 % — AB (ref 0–5)
Eosinophils Absolute: 0.4 10*3/uL (ref 0.0–0.7)
HCT: 36.2 % (ref 36.0–46.0)
Hemoglobin: 12.1 g/dL (ref 12.0–15.0)
LYMPHS PCT: 27 % (ref 12–46)
Lymphs Abs: 1.9 10*3/uL (ref 0.7–4.0)
MCH: 29.7 pg (ref 26.0–34.0)
MCHC: 33.4 g/dL (ref 30.0–36.0)
MCV: 88.7 fL (ref 78.0–100.0)
Monocytes Absolute: 0.4 10*3/uL (ref 0.1–1.0)
Monocytes Relative: 6 % (ref 3–12)
NEUTROS ABS: 4.3 10*3/uL (ref 1.7–7.7)
Neutrophils Relative %: 60 % (ref 43–77)
PLATELETS: 282 10*3/uL (ref 150–400)
RBC: 4.08 MIL/uL (ref 3.87–5.11)
RDW: 15.6 % — AB (ref 11.5–15.5)
WBC: 7 10*3/uL (ref 4.0–10.5)

## 2013-09-07 MED ORDER — ALBUTEROL SULFATE HFA 108 (90 BASE) MCG/ACT IN AERS: INHALATION_SPRAY | RESPIRATORY_TRACT | Status: DC

## 2013-09-07 NOTE — Patient Instructions (Signed)
1. You will be notified if any lab abnormalities.   2. Your Proventil inhaler has been sent to your pharmacy.     3. If you have worsening of your symptoms or new symptoms arise, please call the clinic ([phone]), or go to the ER immediately if symptoms are severe.   4. Return to clinic in 12 months or sooner if needed.

## 2013-09-07 NOTE — Progress Notes (Signed)
Case discussed with Dr. Wilson at the time of the visit.  We reviewed the resident's history and exam and pertinent patient test results.  I agree with the assessment, diagnosis and plan of care documented in the resident's note. 

## 2013-09-07 NOTE — Progress Notes (Signed)
   Subjective:    Patient ID: Susan Brewer, female    DOB: [birth year], 38 y.o.   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  She presents for routine exam and to have paperwork completed for school.  She has complaint of anxiety that is particularly bad during testing situations.  She experiences racing heart, "psyches" herself out when she gets to a difficult question and takes longer to answer questions.  Drinking water during the exam and sitting near the door helps calm her.  She reports occasional periods of anxiety at other times but does not feel they interfere with her day to day activity.  She denies inattention, problems focusing, hyperactivity, mood disturbance or depression.  She says she has had two panic attacks in her life (one occurred while flying).  She previously tried Lexapro for 1 week but says it made her feel horrible.  She does not want to go to psychiatry but feels having more time during the exam and being in a separate location would be helpful for her test anxiety.       Review of Systems  Constitutional: Negative for fever and chills.  HENT: Negative for rhinorrhea and sore throat.   Respiratory: Negative for cough, shortness of breath and wheezing.   Cardiovascular: Positive for palpitations. Negative for chest pain.       Heart palpitations during high anxiety/stress.  Gastrointestinal: Negative for nausea, vomiting, diarrhea and constipation.  Genitourinary: Negative for dysuria.  Psychiatric/Behavioral: Negative for suicidal ideas, hallucinations, behavioral problems, confusion, sleep disturbance, self-injury, dysphoric mood, decreased concentration and agitation. The patient is nervous/anxious. The patient is not hyperactive.        Anxiety during testing situations.       Objective:   Physical Exam  Constitutional: She is oriented to person, place, and time. She appears well-developed. No  distress.  HENT:  Mouth/Throat: Oropharynx is clear and moist. No oropharyngeal exudate.  Eyes: Pupils are equal, round, and reactive to light.  Cardiovascular: Normal rate, regular rhythm and normal heart sounds.   Pulmonary/Chest: Breath sounds normal. No respiratory distress. She has no wheezes. She has no rales.  Abdominal: Soft. Bowel sounds are normal. She exhibits no distension. There is no tenderness.  Neurological: She is alert and oriented to person, place, and time. No cranial nerve deficit. She exhibits normal muscle tone.  5/5 extremity strength  Skin: Skin is warm. She is not diaphoretic.  Psychiatric: She has a normal mood and affect. Her behavior is normal.          Assessment & Plan:  Please see problem based assessment and plan.

## 2013-09-07 NOTE — Assessment & Plan Note (Addendum)
Assessment:  Her symptoms of high anxiety surrounding test taking is consistent with situational (test taking) anxiety.  No inattention or hyperactivity to suggest ADHD.  No symptoms of depression, mood lability or hallucinations to suggest depression, bipolar or psychotic disorders.  She has occasional anxiety not related to testing but she is able to control this and it is not interfering with functioning.  Also, she has no persistent worrying, difficulty concentrating (she says she is able to study without problem) or sleep problems to suggest a more generalized anxiety.    Plan:  Forms completed for her school - requesting increased time for testing and testing in a separate room.

## 2013-11-10 ENCOUNTER — Encounter: Payer: Self-pay | Admitting: Internal Medicine

## 2013-11-10 ENCOUNTER — Ambulatory Visit (INDEPENDENT_AMBULATORY_CARE_PROVIDER_SITE_OTHER): Payer: Medicaid Other | Admitting: Internal Medicine

## 2013-11-10 ENCOUNTER — Ambulatory Visit (HOSPITAL_COMMUNITY)
Admission: RE | Admit: 2013-11-10 | Discharge: 2013-11-10 | Disposition: A | Discharge status: Home / Self Care | Payer: Medicaid Other | Source: Ambulatory Visit | Attending: Internal Medicine | Admitting: Internal Medicine

## 2013-11-10 VITALS — BP 105/71 | HR 67 | Temp 96.6°F | Ht 65.0 in | Wt 187.7 lb

## 2013-11-10 DIAGNOSIS — M542 Cervicalgia: Secondary | ICD-10-CM | POA: Insufficient documentation

## 2013-11-10 DIAGNOSIS — M47812 Spondylosis without myelopathy or radiculopathy, cervical region: Secondary | ICD-10-CM | POA: Insufficient documentation

## 2013-11-10 DIAGNOSIS — M25519 Pain in unspecified shoulder: Secondary | ICD-10-CM | POA: Insufficient documentation

## 2013-11-10 DIAGNOSIS — K219 Gastro-esophageal reflux disease without esophagitis: Secondary | ICD-10-CM | POA: Insufficient documentation

## 2013-11-10 MED ORDER — OMEPRAZOLE 20 MG PO CPDR: 20.0000 mg | DELAYED_RELEASE_CAPSULE | Freq: Every day | ORAL | Status: DC

## 2013-11-10 MED ORDER — DICLOFENAC SODIUM 1 % TD GEL: 2.0000 g | Freq: Four times a day (QID) | TRANSDERMAL | Status: DC

## 2013-11-10 NOTE — Patient Instructions (Signed)
We will prescribe Voltaren gel for you neck and shoulder pain. We will get an XRay of your cervical spine today. We will start a trial of gastric reflux therapy with Prilosec. Return to clinic for f/u in 2-4 weeks. Please bring your medicines with you each time you come.   Medicines may be  Eye drops  Herbal   Vitamins  Pills  Seeing these help [place] take care of you.  Omeprazole tablets (OTC) What is this medicine? OMEPRAZOLE (oh ME pray zol) prevents the production of acid in the stomach. It is used to treat the symptoms of heartburn. You can buy this medicine without a prescription. This product is not for long-term use, unless otherwise directed by your doctor or health care professional. This medicine may be used for other purposes; ask your health care provider or pharmacist if you have questions. COMMON BRAND NAME(S): Prilosec OTC What should I tell my health care provider before I take this medicine? They need to know if you have any of these conditions: -black or bloody stools -chest pain -difficulty swallowing -have had heartburn for over 3 months -have heartburn with dizziness, lightheadedness or sweating -liver disease -stomach pain -unexplained weight loss -vomiting with blood -wheezing -an unusual or allergic reaction to omeprazole, other medicines, foods, dyes, or preservatives -pregnant or trying to get pregnant -breast-feeding How should I use this medicine? Take this medicine by mouth. Follow the directions on the product label. If you are taking this medicine without a prescription, take one tablet every day. Do not use for longer than 14 days or repeat a course of treatment more often than every 4 months unless directed by a doctor or healthcare professional. Take your dose at regular intervals every 24 hours. Swallow the tablet whole with a drink of water. Do not crush, break or chew. This medicine works best if taken on an empty stomach 30 minutes before  breakfast. If you are using this medicine with the prescription of your doctor or healthcare professional, follow the directions you were given. Do not take your medicine more often than directed. Talk to your pediatrician regarding the use of this medicine in children. Special care may be needed. Overdosage: If you think you have taken too much of this medicine contact a poison control center or emergency room at once. NOTE: This medicine is only for you. Do not share this medicine with others. What if I miss a dose? If you miss a dose, take it as soon as you can. If it is almost time for your next dose, take only that dose. Do not take double or extra doses. What may interact with this medicine? Do not take this medicine with any of the following medications: -atazanavir -clopidogrel -nelfinavir This medicine may also interact with the following medications: -ampicillin -certain medicines for anxiety or sleep -certain medicines that treat or prevent blood clots like warfarin -cyclosporine -diazepam -digoxin -disulfiram -iron salts -phenytoin -prescription medicine for fungal or yeast infection like itraconazole, ketoconazole, voriconazole -saquinavir -tacrolimus This list may not describe all possible interactions. Give your health care provider a list of all the medicines, herbs, non-prescription drugs, or dietary supplements you use. Also tell them if you smoke, drink alcohol, or use illegal drugs. Some items may interact with your medicine. What should I watch for while using this medicine? It can take several days before your heartburn gets better. Check with your doctor or health care professional if your condition does not start to get better, or if  it gets worse. Do not treat diarrhea with over the counter products. Contact your doctor if you have diarrhea that lasts more than 2 days or if it is severe and watery. Do not treat yourself for heartburn with this medicine for more  than 14 days in a row. You should only use this medicine for a 2-week treatment period once every 4 months. If your symptoms return shortly after your therapy is complete, or within the 4 month time frame, call your doctor or health care professional. What side effects may I notice from receiving this medicine? Side effects that you should report to your doctor or health care professional as soon as possible: -allergic reactions like skin rash, itching or hives, swelling of the face, lips, or tongue -bone, muscle or joint pain -breathing problems -chest pain or chest tightness -dark yellow or brown urine -diarrhea -dizziness -fast, irregular heartbeat -feeling faint or lightheaded -fever or sore throat -muscle spasm -palpitations -redness, blistering, peeling or loosening of the skin, including inside the mouth -seizures -tremors -unusual bleeding or bruising -unusually weak or tired -yellowing of the eyes or skin Side effects that usually do not require medical attention (Report these to your doctor or health care professional if they continue or are bothersome.): -constipation -dry mouth -headache -loose stools -nausea This list may not describe all possible side effects. Call your doctor for medical advice about side effects. You may report side effects to FDA at 1-800-FDA-1088. Where should I keep my medicine? Keep out of the reach of children. Store at room temperature between 20 and 25 degrees C (68 and 77 degrees F). Protect from light and moisture. Throw away any unused medicine after the expiration date. NOTE: This sheet is a summary. It may not cover all possible information. If you have questions about this medicine, talk to your doctor, pharmacist, or health care provider.  2014, Elsevier/Gold Standard. (2011-04-30 11:40:25)  Diclofenac skin gel What is this medicine? DICLOFENAC (dye KLOE fen ak) is a non-steroidal anti-inflammatory drug (NSAID). The 1% skin gel is  used to treat osteoarthritis of the hands or knees. The 3% skin gel is used to treat actinic keratosis. This medicine may be used for other purposes; ask your health care provider or pharmacist if you have questions. COMMON BRAND NAME(S): Solaraze, Voltaren Gel What should I tell my health care provider before I take this medicine? They need to know if you have any of these conditions: -asthma -bleeding problems -coronary artery bypass graft (CABG) surgery within the past 2 weeks -heart disease -high blood pressure -if you frequently drink alcohol containing drinks -kidney disease -liver disease -open or infected skin -stomach problems -an unusual or allergic reaction to diclofenac, aspirin, other NSAIDs, other medicines, benzyl alcohol (3% gel only), foods, dyes, or preservatives -pregnant or trying to get pregnant -breast-feeding How should I use this medicine? This medicine is for external use only. Follow the directions on the prescription label. Wash hands before and after use. Do not get this medicine in your eyes. If you do, rinse out with plenty of cool tap water. Use your doses at regular intervals. Do not use your medicine more often than directed. A special MedGuide will be given to you by the pharmacist with each prescription and refill of the 1% gel. Be sure to read this information carefully each time. Talk to your pediatrician regarding the use of this medicine in children. Special care may be needed. The 3% gel is not approved for use in children.  Overdosage: If you think you have taken too much of this medicine contact a poison control center or emergency room at once. NOTE: This medicine is only for you. Do not share this medicine with others. What if I miss a dose? If you miss a dose, use it as soon as you can. If it is almost time for your next dose, use only that dose. Do not use double or extra doses. What may interact with this medicine? -aspirin -NSAIDs, medicines  for pain and inflammation, like ibuprofen or naproxen Do not use any other skin products without telling your doctor or health care professional. This list may not describe all possible interactions. Give your health care provider a list of all the medicines, herbs, non-prescription drugs, or dietary supplements you use. Also tell them if you smoke, drink alcohol, or use illegal drugs. Some items may interact with your medicine. What should I watch for while using this medicine? Tell your doctor or healthcare professional if your symptoms do not start to get better or if they get worse. You will need to follow up with your health care provider to monitor your progress. You may need to be treated for up to 3 months if you are using the 3% gel, but the full effect may not occur until 1 month after stopping treatment. If you develop a severe skin reaction, contact your doctor or health care professional immediately. This medicine can make you more sensitive to the sun. Keep out of the sun. If you cannot avoid being in the sun, wear protective clothing and use sunscreen. Do not use sun lamps or tanning beds/booths. Do not take medicines such as ibuprofen and naproxen with this medicine. Side effects such as stomach upset, nausea, or ulcers may be more likely to occur. Many medicines available without a prescription should not be taken with this medicine. This medicine does not prevent heart attack or stroke. In fact, this medicine may increase the chance of a heart attack or stroke. The chance may increase with longer use of this medicine and in people who have heart disease. If you take aspirin to prevent heart attack or stroke, talk with your doctor or health care professional. This medicine can cause ulcers and bleeding in the stomach and intestines at any time during treatment. Do not smoke cigarettes or drink alcohol. These increase irritation to your stomach and can make it more susceptible to damage from  this medicine. Ulcers and bleeding can happen without warning symptoms and can cause death. You may get drowsy or dizzy. Do not drive, use machinery, or do anything that needs mental alertness until you know how this medicine affects you. Do not stand or sit up quickly, especially if you are an older patient. This reduces the risk of dizzy or fainting spells. This medicine can cause you to bleed more easily. Try to avoid damage to your teeth and gums when you brush or floss your teeth. What side effects may I notice from receiving this medicine? Side effects that you should report to your doctor or health care professional as soon as possible: -allergic reactions like skin rash, itching or hives, swelling of the face, lips, or tongue -black or bloody stools, blood in the urine or vomit -blurred vision -chest pain -difficulty breathing or wheezing -nausea or vomiting -redness, blistering, peeling or loosening of the skin, including inside the mouth -slurred speech or weakness on one side of the body -trouble passing urine or change in the amount of  urine -unexplained weight gain or swelling -unusually weak or tired -yellowing of eyes or skin Side effects that usually do not require medical attention (report to your doctor or health care professional if they continue or are bothersome): -dizziness -dry skin -headache -heartburn -increased sensitivity to the sun -stomach pain -tingling at the application site This list may not describe all possible side effects. Call your doctor for medical advice about side effects. You may report side effects to FDA at 1-800-FDA-1088. Where should I keep my medicine? Keep out of the reach of children. Store the 1% gel at room temperature between 15 and 30 degrees C (59 and 86 degrees F). Store the 3% gel at room temperature between 20 and 25 degrees C (68 and 77 degrees F). Protect from light. Throw away any unused medicine after the expiration  date. NOTE: This sheet is a summary. It may not cover all possible information. If you have questions about this medicine, talk to your doctor, pharmacist, or health care provider.  2014, Elsevier/Gold Standard. (2007-12-01 16:35:07)

## 2013-11-10 NOTE — Assessment & Plan Note (Signed)
Worsening symptoms, now associated with bilateral finger numbness, pt with appropriate range of motion at cervical spine,, no weakness of hand or arm muscles -cervical XRay spine to eval for cervical radiculopathy -Voltaren gel prn -reports that she will have insurance once married in 6 weeks

## 2013-11-10 NOTE — Progress Notes (Signed)
Subjective:    Patient ID: Susan Brewer, female    DOB: Nov 09, 1975, 38 y.o.   MRN: [id number]  HPI  38 yo with hx significant for chronic neck pain presents to clinic with complaints of continued neck and shoulder pain now with numbness in fingers upon awakening in the morning.  Has had Sports Medicine evaluation and Physical Therapy in the past as well as been under the care of a chiropractor without resolution.  Pt states that chiropractor stated that she had "pressed down disk" which would cause her problems in the future. 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  Has taken flexeril in the past but did not like the "drugged" feeling that it caused.  She is currently using "[place]u" with minimal relief.    She also reports stomach and intestinal fullness and fears that she may have "an ulcer".  Has not taking any antiacids including no Tums, H2 blockers or PPIs because she does not like pills.  Also voices concern that she may have Irritable Bowel Syndrome.  Denies blood stools, flatulence or over abdominal pain but states that she does have mucus in her stool from time to time and has a "weird" feeling in her abdomen and knows that "something isnt quite right".   Review of Systems  Constitutional: Negative for fever, fatigue and unexpected weight change.  HENT: Negative.   Eyes: Negative.   Respiratory: Negative.   Cardiovascular: Negative.   Gastrointestinal: Positive for diarrhea. Negative for abdominal pain, constipation, blood in stool and abdominal distention.       Mucoid stool at times, diarrhea if stressed  Endocrine: Negative for polyuria.  Genitourinary: Negative for dysuria, menstrual problem and pelvic pain.       Fibroids  Musculoskeletal: Positive for neck pain. Negative for neck stiffness.       Shoulder pain bilaterally    Allergic/Immunologic: Negative for food allergies.  Neurological: Positive for numbness. Negative for dizziness, light-headedness and headaches.       Numb fingers in the morning bilaterally  Hematological: Does not bruise/bleed easily.  Psychiatric/Behavioral: Negative.        Objective:   Physical Exam  Constitutional: She is oriented to person, place, and time. She appears well-developed and well-nourished. No distress.  HENT:  Head: Normocephalic and atraumatic.  Eyes: Conjunctivae and EOM are normal. Pupils are equal, round, and reactive to light.  Neck: Normal range of motion. Neck supple. No thyromegaly present.  Cardiovascular: Normal rate, regular rhythm, normal heart sounds and intact distal pulses.   Pulmonary/Chest: Effort normal and breath sounds normal.  Abdominal: Soft. Bowel sounds are normal.  Musculoskeletal: Normal range of motion. She exhibits no edema.       Cervical back: She exhibits tenderness. She exhibits normal range of motion and no bony tenderness.  Lymphadenopathy:    She has no cervical adenopathy.  Neurological: She is alert and oriented to person, place, and time.  Skin: Skin is warm and dry.  Psychiatric: She has a normal mood and affect.          Assessment & Plan:  See separate problem-list charting for detailed A/P.  -neck/shoulder pain: concern for cervical radiculopathy, cervical Xray today, voltaren gel for muscle strain/spasms  -GERD, possible: trial PPI tx, will likely require GI referral for eval for IBS

## 2013-11-10 NOTE — Assessment & Plan Note (Addendum)
No brash taste in mouth, reports fullness in epigastric region without burning sensation. Reports daily bowel normal movements. Other symptoms could be consistent with IBS including occasional mucoid stool, diarrhea associated with stress, and difficult to characterize abdominal discomfort. Cousin with IBS.  -Omeprazole 20 mg qd x 4 weeks -referral to GI if trial of PPI not effective

## 2013-11-10 NOTE — Progress Notes (Signed)
Case discussed with Dr. Michail Sermon soon after the resident saw the patient.  We reviewed the resident's history and exam and pertinent patient test results.  I agree with the assessment, diagnosis, and plan of care documented in the resident's note.  If there are no red flag symptoms and her evaluation, history and examination are consistent with irritable bowel syndrome she may also be managed in the [facility].

## 2014-02-10 ENCOUNTER — Telehealth: Payer: Self-pay | Admitting: General Practice

## 2014-02-10 NOTE — Telephone Encounter (Signed)
Patient called and left message stating she was seen here last July and was told to call a year from then to make appt for procedure and pap smear. Per chart review, patient needs to come in for pap with HPV testing. Called patient, no answer- left message that I am trying to return your phone call, please call us back at the clinics

## 2014-02-10 NOTE — Telephone Encounter (Signed)
Patient called back and I spoke with her at front desk. She states that she thought she was getting a procedure on her fallopian tubes. I advised that when she comes in she can discuss this with her doctor. Patient is agreeable to this.

## 2014-02-11 ENCOUNTER — Ambulatory Visit (INDEPENDENT_AMBULATORY_CARE_PROVIDER_SITE_OTHER): Payer: BC Managed Care – PPO | Admitting: Internal Medicine

## 2014-02-11 ENCOUNTER — Encounter: Payer: Self-pay | Admitting: Internal Medicine

## 2014-02-11 VITALS — BP 103/71 | HR 64 | Temp 97.5°F | Ht 65.0 in | Wt 191.7 lb

## 2014-02-11 DIAGNOSIS — J019 Acute sinusitis, unspecified: Secondary | ICD-10-CM | POA: Diagnosis not present

## 2014-02-11 MED ORDER — SALINE SPRAY 0.65 % NA SOLN: 2.0000 | NASAL | Status: DC | PRN

## 2014-02-11 NOTE — Assessment & Plan Note (Signed)
Clinical symptoms and signs suggestive of Acute Rhinosinositis. No clinical signs suggestive of bacterial co-infection. Discussed with the attending regarding further management.  Plans: Recommended to start using NS spray in both nostrils 3-4 times daily as needed for sinus relief. Recommended to use Tylenol or Ibuprofen as needed for the headaches. Follow up as needed if symptoms persist or worsen.

## 2014-02-11 NOTE — Patient Instructions (Signed)
Use the nasal saline spray as instructed.  Sinusitis Sinusitis is redness, soreness, and swelling (inflammation) of the paranasal sinuses. Paranasal sinuses are air pockets within the bones of your face (beneath the eyes, the middle of the forehead, or above the eyes). In healthy paranasal sinuses, mucus is able to drain out, and air is able to circulate through them by way of your nose. However, when your paranasal sinuses are inflamed, mucus and air can become trapped. This can allow bacteria and other germs to grow and cause infection. Sinusitis can develop quickly and last only a short time (acute) or continue over a long period (chronic). Sinusitis that lasts for more than 12 weeks is considered chronic.  CAUSES  Causes of sinusitis include:  Allergies.  Structural abnormalities, such as displacement of the cartilage that separates your nostrils (deviated septum), which can decrease the air flow through your nose and sinuses and affect sinus drainage.  Functional abnormalities, such as when the small hairs (cilia) that line your sinuses and help remove mucus do not work properly or are not present. SYMPTOMS  Symptoms of acute and chronic sinusitis are the same. The primary symptoms are pain and pressure around the affected sinuses. Other symptoms include:  Upper toothache.  Earache.  Headache.  Bad breath.  Decreased sense of smell and taste.  A cough, which worsens when you are lying flat.  Fatigue.  Fever.  Thick drainage from your nose, which often is green and may contain pus (purulent).  Swelling and warmth over the affected sinuses. DIAGNOSIS  Your caregiver will perform a physical exam. During the exam, your caregiver may:  Look in your nose for signs of abnormal growths in your nostrils (nasal polyps).  Tap over the affected sinus to check for signs of infection.  View the inside of your sinuses (endoscopy) with a special imaging device with a light attached  (endoscope), which is inserted into your sinuses. If your caregiver suspects that you have chronic sinusitis, one or more of the following tests may be recommended:  Allergy tests.  Nasal culture--A sample of mucus is taken from your nose and sent to a lab and screened for bacteria.  Nasal cytology--A sample of mucus is taken from your nose and examined by your caregiver to determine if your sinusitis is related to an allergy. TREATMENT  Most cases of acute sinusitis are related to a viral infection and will resolve on their own within 10 days. Sometimes medicines are prescribed to help relieve symptoms (pain medicine, decongestants, nasal steroid sprays, or saline sprays).  However, for sinusitis related to a bacterial infection, your caregiver will prescribe antibiotic medicines. These are medicines that will help kill the bacteria causing the infection.  Rarely, sinusitis is caused by a fungal infection. In theses cases, your caregiver will prescribe antifungal medicine. For some cases of chronic sinusitis, surgery is needed. Generally, these are cases in which sinusitis recurs more than 3 times per year, despite other treatments. HOME CARE INSTRUCTIONS   Drink plenty of water. Water helps thin the mucus so your sinuses can drain more easily.  Use a humidifier.  Inhale steam 3 to 4 times a day (for example, sit in the bathroom with the shower running).  Apply a warm, moist washcloth to your face 3 to 4 times a day, or as directed by your caregiver.  Use saline nasal sprays to help moisten and clean your sinuses.  Take over-the-counter or prescription medicines for pain, discomfort, or fever only as directed  by your caregiver. SEEK IMMEDIATE MEDICAL CARE IF:  You have increasing pain or severe headaches.  You have nausea, vomiting, or drowsiness.  You have swelling around your face.  You have vision problems.  You have a stiff neck.  You have difficulty breathing. MAKE SURE  YOU:   Understand these instructions.  Will watch your condition.  Will get help right away if you are not doing well or get worse. Document Released: 07/30/2005 Document Revised: 10/22/2011 Document Reviewed: 08/14/2011 [facility] Patient Information 2015 [place], [place]. This information is not intended to replace advice given to you by your health care provider. Make sure you discuss any questions you have with your health care provider.

## 2014-02-11 NOTE — Progress Notes (Signed)
Subjective:   Patient ID: Susan Brewer female   DOB: [birth year] 38 y.o.   MRN: [id number]  HPI: [name] is a 38 y.o. woman with no significant PMH comes to the office with CC of sinus headaches x 7 days.  Patient reports that her symptoms started one week ago with runny nose, cough, congestion, head ache that lasted about 3-4 days. She used OTC Ibuprofen as needed and the symptoms resolved mostly except for sinus headaches on the right side, mucous discharge from the nose. She denies any fever, chills, sob, cough, wheezing, chest pain, dizziness, nausea, vomiting. 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  She denies any other complaints.   Past Medical History  Diagnosis Date  . Anxiety   . Asthma     No PFT in records   . 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 Neck pain, chronic 10/01/2012  . Fibroids   . Test anxiety 10/01/2012   Current Outpatient Prescriptions  Medication Sig Dispense Refill  . albuterol (PROVENTIL HFA) 108 (90 BASE) MCG/ACT inhaler Inhale 2 puffs into the lungs 5-30 minutes prior to exercise.  6.7 g  3  . ibuprofen (ADVIL,MOTRIN) 200 MG tablet Take 600 mg by mouth every 6 (six) hours as needed. Take as needed for muscle tension, pain       . omeprazole (PRILOSEC) 20 MG capsule Take 1 capsule (20 mg total) by mouth daily.  30 capsule  0   No current facility-administered medications for this visit.   Family History  Problem Relation Age of Onset  . Asthma Son   . Cancer Maternal Aunt     Uterine cancer   . Cancer Maternal Grandmother     Lymphoma   . Sudden death Neg Hx   . Hypertension Neg Hx   . Heart attack Neg Hx   . Hyperlipidemia Neg Hx   . Diabetes Neg Hx    History   Social History  . Marital Status: Single    Spouse Name: N/A    Number of Children: N/A  . Years of Education: N/A   Social History Main Topics  . Smoking status: Former Smoker    Types: Cigarettes    Quit date: 03/26/2011    . Smokeless tobacco: Never Used  . Alcohol Use: 1.2 oz/week    2 Glasses of wine per week     Comment: wine seldom  . Drug Use: No  . Sexual Activity: None   Other Topics Concern  . None   Social History Narrative  . None   Review of Systems: Pertinent items are noted in HPI. Objective:  Physical Exam: Filed Vitals:   02/11/14 1418  BP: 103/71  Pulse: 64  Temp: 97.5 F (36.4 C)  TempSrc: Oral  Height: 5\' 5"  (1.651 m)  Weight: 191 lb 11.2 oz (86.955 kg)  SpO2: 99%   Constitutional: Vital signs reviewed.  Patient is a well-developed and well-nourished and is in no acute distress and cooperative with exam.  Head: Normocephalic and atraumatic Ear: TM normal bilaterally. Mild cerumen noted in the right ear. Nose: Mild enalrged inferior turbinates on the left nostril without any exudates.  Mouth: Mild enlarged tonsils bilaterally. Normal appearing posterior pharynx without any exudates.  Neck: Supple, No cervical LN.  Cardiovascular: RRR, S1 normal, S2 normal, no MRG Pulmonary/Chest: normal respiratory effort, CTAB, no wheezes, rales, or rhonchi Neurological: A&O x3 Psychiatric: Normal mood and affect.  Assessment &  Plan:

## 2014-02-16 NOTE — Progress Notes (Signed)
Case discussed with Dr. Boggala at the time of the visit.  We reviewed the resident's history and exam and pertinent patient test results.  I agree with the assessment, diagnosis, and plan of care documented in the resident's note. 

## 2014-02-19 ENCOUNTER — Ambulatory Visit: Payer: Medicaid Other | Admitting: Internal Medicine

## 2014-02-19 ENCOUNTER — Ambulatory Visit (INDEPENDENT_AMBULATORY_CARE_PROVIDER_SITE_OTHER): Payer: BC Managed Care – PPO | Admitting: Internal Medicine

## 2014-02-19 ENCOUNTER — Encounter: Payer: Self-pay | Admitting: Internal Medicine

## 2014-02-19 VITALS — BP 104/70 | HR 80 | Temp 98.6°F | Ht 60.0 in | Wt 190.6 lb

## 2014-02-19 DIAGNOSIS — F418 Other specified anxiety disorders: Secondary | ICD-10-CM

## 2014-02-19 DIAGNOSIS — F419 Anxiety disorder, unspecified: Secondary | ICD-10-CM

## 2014-02-19 DIAGNOSIS — F411 Generalized anxiety disorder: Secondary | ICD-10-CM

## 2014-02-19 MED ORDER — ALBUTEROL SULFATE HFA 108 (90 BASE) MCG/ACT IN AERS: INHALATION_SPRAY | RESPIRATORY_TRACT | Status: DC

## 2014-02-19 NOTE — Progress Notes (Signed)
   Subjective:    Patient ID: Susan Brewer, female    DOB: 08-18-1975, 38 y.o.   MRN: [id number]  HPI Comments: Susan Brewer is a 38 year old woman with a PMH of situational anxiety and panic attack who presents for follow-up.  She is in nursing school.   She had improvement in her symptoms testing alone in the testing center with time and half.  Recently married and about to start last year of nursing school.  Difficulty sleeping due to anxiety over testing.  She does sometimes worry about minor things.  Sometimes sleep difficulties.  She is able to calm herself down and go back to sleep.  Sometimes is anxious for no reason.  Has had two panic attacks in her life but none recently.  Denies sadness, anhedonia, SI or HI.  Her appetite is good.  She works out when she can.  Good family support system.  She was recently seen in clinic for allergy symptoms which have improved with Nasal spray.       Review of Systems  Constitutional: Negative for fever, chills and appetite change.  Respiratory: Positive for cough. Negative for shortness of breath and wheezing.   Cardiovascular: Positive for palpitations. Negative for chest pain and leg swelling.  Gastrointestinal: Negative for nausea, vomiting, diarrhea and constipation.  Genitourinary: Negative for dysuria and frequency.  Musculoskeletal:       Muscle tension - cervical  Neurological: Negative for dizziness, syncope and light-headedness.  Psychiatric/Behavioral: Positive for sleep disturbance. Negative for suicidal ideas, hallucinations, dysphoric mood and decreased concentration. The patient is nervous/anxious. The patient is not hyperactive.        Objective:   Physical Exam  Vitals reviewed. Constitutional: She is oriented to person, place, and time. She appears well-developed. No distress.  HENT:  Head: Normocephalic and atraumatic.  Mouth/Throat: Oropharynx is clear and moist. No oropharyngeal exudate.  Sinuses and B/L mastoids  non-tender; right TM normal; left TM obscured by wax in the canal.  Eyes: EOM are normal. Pupils are equal, round, and reactive to light.  Neck: Normal range of motion. Neck supple.  Cardiovascular: Normal rate, regular rhythm and normal heart sounds.  Exam reveals no gallop and no friction rub.   No murmur heard. Pulmonary/Chest: Effort normal and breath sounds normal. No respiratory distress. She has no wheezes. She has no rales.  Abdominal: Soft. Bowel sounds are normal. She exhibits no distension. There is no tenderness.  Musculoskeletal: Normal range of motion. She exhibits no edema and no tenderness.  Lymphadenopathy:    She has no cervical adenopathy.  Neurological: She is alert and oriented to person, place, and time. No cranial nerve deficit.  Skin: Skin is warm. She is not diaphoretic.  Psychiatric: She has a normal mood and affect. Her behavior is normal.          Assessment & Plan:  Please see problem based assessment and plan.

## 2014-02-19 NOTE — Patient Instructions (Addendum)
1. I will fax your paperwork to your school on Monday.  Please consider seeing a  therapist or counselor for cognitive behavioral therapy.  You can consider medications if the future if symptoms worsen.     2. Please take all medications as prescribed.     3. If you have worsening of your symptoms or new symptoms arise, please call the clinic ([phone]), or go to the ER immediately if symptoms are severe.   Return to clinic in 6-12 months for follow-up.  See me sooner if you have problems.  Don't forget to get your PAP at [facility].

## 2014-02-21 DIAGNOSIS — F419 Anxiety disorder, unspecified: Secondary | ICD-10-CM | POA: Insufficient documentation

## 2014-02-21 NOTE — Assessment & Plan Note (Addendum)
She now exhibits anxiety about various things including upcoming exams at school and son about to go off to college.  Also with irritability, sleep disturbance (though she is able to fall back to sleep) and muscle tension (she has seen sports medicine for chronic neck pain).  Presentation not c/w ADHD or depression.  She scores between 7 and 10 on the GAD-7 anxiety scale which places her in a mild category. She may have mild GAD with fluctuation in symptoms.  Recent increase in symptoms likely precipitated by life events (she got married this year, she is finishing up nursing school and her son will be heading off to college).  While she does worry about several things she seems able to calm herself down.  Prior to being able to test by herself and with additional time she was experiencing significant anxiety during testing situations.  She has had improvement in her symptoms (related to testing) this past semester since she has been able to test by herself and with time and half.  I believe that these small changes are necessary to help reduce her anxiety particularly surrounding school/test taking.  I do not feel her symptoms are severe enough to warrant pharmacotherapy at this point nor does the patient want to try pharmacotherapy yet. - will fill out forms for school requesting continued test accomodations -  increased time for testing and testing in a separate room. - patient to [facility] on 07/13 to pick up forms - asked her to consider CBT but she seems hesitant - RTC in 6 months - 1 year or sooner prn

## 2014-02-26 NOTE — Progress Notes (Signed)
Case discussed with Dr. Wilson at the time of the visit.  We reviewed the resident's history and exam and pertinent patient test results.  I agree with the assessment, diagnosis, and plan of care documented in the resident's note. 

## 2014-03-25 ENCOUNTER — Ambulatory Visit: Payer: Medicaid Other | Admitting: Obstetrics & Gynecology

## 2014-04-28 ENCOUNTER — Encounter: Payer: Self-pay | Admitting: Obstetrics & Gynecology

## 2014-04-28 ENCOUNTER — Ambulatory Visit (INDEPENDENT_AMBULATORY_CARE_PROVIDER_SITE_OTHER): Payer: BC Managed Care – PPO | Admitting: Obstetrics & Gynecology

## 2014-04-28 VITALS — BP 112/70 | HR 65 | Resp 20 | Ht 65.0 in | Wt 191.1 lb

## 2014-04-28 DIAGNOSIS — Z8759 Personal history of other complications of pregnancy, childbirth and the puerperium: Secondary | ICD-10-CM

## 2014-04-28 DIAGNOSIS — Z8742 Personal history of other diseases of the female genital tract: Secondary | ICD-10-CM | POA: Diagnosis not present

## 2014-04-28 DIAGNOSIS — Z0000 Encounter for general adult medical examination without abnormal findings: Secondary | ICD-10-CM | POA: Diagnosis not present

## 2014-04-28 NOTE — Patient Instructions (Signed)
Hysterosalpingography Hysterosalpingography is a procedure to look inside your uterus and fallopian tubes. During this procedure, contrast dye is injected into your uterus through your vagina and cervix to illuminate your uterus while X-ray pictures are taken. This procedure may help your health care provider determine whether you have uterine tumors, adhesions, or structural abnormalities. It is commonly used to help determine why a woman is unable to have children (infertility). The procedure usually lasts about 15-30 minutes. LET [facility] CARE PROVIDER KNOW ABOUT:  Any allergies you have.  All medicines you are taking, including vitamins, herbs, eye drops, creams, and over-the-counter medicines.  Previous problems you or members of your family have had with the use of anesthetics.  Any blood disorders you have.  Previous surgeries you have had.  Medical conditions you have. RISKS AND COMPLICATIONS  Generally, this is a safe procedure. However, as with any procedure, problems can occur. Possible problems include:  Infection in the lining of the uterus (endometritis) or fallopian tubes (salpingitis).  Damage or perforation of the uterus or fallopian tubes.  An allergic reaction to the contrast dye used to perform the X-ray. BEFORE THE PROCEDURE   Schedule the procedure after your period stops, but before your next ovulation. This is usually between day 5 and day 10 of your last period. Day 1 is the first day of your period.  Ask your health care provider about changing or stopping your regular medicines.  You may eat and drink as normal.  Empty your bladder before the procedure begins. PROCEDURE  You may be given a medicine to relax you (sedative) or an over-the-counter pain medicine to lessen any discomfort during the procedure.  You will lie down on an X-ray table with your feet in stirrups.  A device called a speculum will be placed into your vagina. This allows your  health care provider to see inside your vagina to the cervix.  The cervix will be washed with a special soap.  A thin, flexible tube will be passed through the cervix into the uterus.  Contrast dye will be put into this tube.  Several X-rays will be taken as the contrast dye spreads through the uterus and fallopian tubes.  The tube will be taken out after the procedure. AFTER THE PROCEDURE   Most of the contrast dye will flow out of your vagina naturally. You may want to wear a sanitary pad.  You may feel mild cramping and notice a little bleeding from your vagina. This should go away in 24 hours.  Ask when your test results will be ready. Make sure you get your test results. Document Released: 09/01/2004 Document Revised: 08/04/2013 Document Reviewed: 01/30/2013 [facility] Patient Information 2015 [place], [place]. This information is not intended to replace advice given to you by your health care provider. Make sure you discuss any questions you have with your health care provider.

## 2014-04-28 NOTE — Progress Notes (Signed)
Patient ID: Susan Brewer, female   DOB: [birth year], 38 y.o.   MRN: [id number] Subjective:     Susan Brewer is a 38 y.o. female here for a routine exam.  Current complaints: pt recently married and wants eval of her fallopian tubes to check for patency as she prev had an ectopic pregnancy.  She is graduating from nursing school in 2 months.   Gynecologic History Patient's last menstrual period was 04/14/2014. Contraception: none Last Pap: 02/2013. Results were: Neg with neg HR HPV   Obstetric History OB History  Gravida Para Term Preterm AB SAB TAB Ectopic Multiple Living  2 1 1  1   1  1     # Outcome Date GA Lbr Len/2nd Weight Sex Delivery Anes PTL Lv  2 TRM           1 ECT              Past Medical History  Diagnosis Date  . Anxiety   . Asthma     No PFT in records   . 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 Neck pain, chronic 10/01/2012  . Fibroids   . Test anxiety 10/01/2012  . GERD (gastroesophageal reflux disease)      The following portions of the patient's history were reviewed and updated as appropriate: allergies, current medications, past family history, past medical history, past social history, past surgical history and problem list.  Review of Systems A comprehensive review of systems was negative.    Objective:    BP 112/70  Pulse 65  Resp 20  Ht 5\' 5"  (1.651 m)  Wt 191 lb 1.6 oz (86.682 kg)  BMI 31.80 kg/m2  LMP 04/14/2014  General Appearance:    Alert, cooperative, no distress, appears stated age                    Back:     Symmetric, no curvature, ROM normal, no CVA tenderness  Lungs:     Clear to auscultation bilaterally, respirations unlabored  Chest Wall:    No tenderness or deformity   Heart:    Regular rate and rhythm, S1 and S2 normal, no murmur, rub   or gallop  Breast Exam:    No tenderness, masses, or nipple abnormality  Abdomen:     Soft, non-tender, bowel sounds active all four quadrants,    no masses, no organomegaly  Genitalia:     Normal female without lesion, discharge or tenderness     Extremities:   Extremities normal, atraumatic, no cyanosis or edema  Pulses:   2+ and symmetric all extremities  Skin:   Skin color, texture, turgor normal, no rashes or lesions  Lymph nodes:   Cervical, supraclavicular, and axillary nodes normal         Assessment:    Healthy female exam.  H/o ectopic pregnancy wants to determine tubal patentcy    Plan:    Follow up in: 1 year.   HSG to confirm tubal patency Given info to contact Dr. Kerin Perna prn PNV 1 po q day OTC is fine F/u cervical cx

## 2014-04-29 LAB — GC/CHLAMYDIA PROBE AMP
CT PROBE, AMP APTIMA: NEGATIVE
GC PROBE AMP APTIMA: NEGATIVE

## 2014-05-17 ENCOUNTER — Telehealth: Payer: Self-pay | Admitting: General Practice

## 2014-05-17 NOTE — Telephone Encounter (Signed)
Patient called and left message stating she would like to speak with a nurse. States she had an ectopic pregnancy 4 years ago and is a week and a half late and has been cramping lately and having nausea as well as breast tenderness and nipple discharge and all home pregnancy test were negative. Called patient back and she states that she has been trying to get pregnant the past couple months and has very regular periods coming every 27-28 days. States last week her lower back started to hurt and she has been feeling some cramps lately like her period is going to come on. Also reports breast tenderness and a clear discharge from her nipples when she stimulates them and that she is able to check her cervix and it felt closed. Patient states she is 2 days late and her home pregnancy test was negative. Told patient to wait a week to take another test. Patient verbalized understanding and asked if she could have a blood test done to check if it was still negative. Told patient that if she was pregnant that the test would have come back positive by that point. Patient verbalized understanding and had no other questions

## 2014-05-24 ENCOUNTER — Ambulatory Visit (HOSPITAL_COMMUNITY)
Admission: RE | Admit: 2014-05-24 | Discharge: 2014-05-24 | Disposition: A | Discharge status: Home / Self Care | Payer: BC Managed Care – PPO | Source: Ambulatory Visit | Attending: Obstetrics & Gynecology | Admitting: Obstetrics & Gynecology

## 2014-05-24 DIAGNOSIS — N979 Female infertility, unspecified: Secondary | ICD-10-CM | POA: Insufficient documentation

## 2014-05-24 DIAGNOSIS — Z8759 Personal history of other complications of pregnancy, childbirth and the puerperium: Secondary | ICD-10-CM

## 2014-05-24 MED ORDER — IOHEXOL 300 MG/ML  SOLN
20.0000 mL | Freq: Once | INTRAMUSCULAR | Status: AC | PRN
Start: 2014-05-24 — End: 2014-05-24
  Administered 2014-05-24: 20 mL

## 2014-05-25 ENCOUNTER — Telehealth: Payer: Self-pay | Admitting: General Practice

## 2014-05-25 NOTE — Telephone Encounter (Signed)
Message copied by Shelly Coss on Tue May 25, 2014 10:27 AM ------      Message from: Lavonia Drafts      Created: Tue May 25, 2014 10:10 AM       Please call pt.  Her fallopian tubes are both open on her HSG.            clh-S  ------

## 2014-05-25 NOTE — Telephone Encounter (Signed)
Called patient and informed her of results. Patient verbalized understanding and had no other questions

## 2014-06-14 ENCOUNTER — Encounter: Payer: Self-pay | Admitting: Obstetrics & Gynecology

## 2014-08-20 ENCOUNTER — Ambulatory Visit (INDEPENDENT_AMBULATORY_CARE_PROVIDER_SITE_OTHER): Payer: BLUE CROSS/BLUE SHIELD | Admitting: Internal Medicine

## 2014-08-20 ENCOUNTER — Encounter: Payer: Self-pay | Admitting: Internal Medicine

## 2014-08-20 VITALS — BP 111/71 | HR 74 | Temp 97.8°F | Ht 65.0 in | Wt 194.0 lb

## 2014-08-20 DIAGNOSIS — J069 Acute upper respiratory infection, unspecified: Secondary | ICD-10-CM | POA: Diagnosis not present

## 2014-08-20 DIAGNOSIS — R110 Nausea: Secondary | ICD-10-CM | POA: Diagnosis not present

## 2014-08-20 LAB — POCT URINE PREGNANCY: PREG TEST UR: NEGATIVE

## 2014-08-20 MED ORDER — AZELASTINE HCL 0.1 % NA SOLN: 2.0000 | Freq: Two times a day (BID) | NASAL | Status: DC

## 2014-08-20 NOTE — Progress Notes (Signed)
[facility] Subjective:   Patient ID: Susan Brewer female   DOB: 1976/06/05 39 y.o.   MRN: [id number]  HPI: [name] is a 39 y.o. female with a PMH below who presents for an acute visit for sinus pain, nasal drainage, headache, dizziness, and fatigue. She reports these symptoms started She notes she is 1 day late for her period. She notes that she took care of her 53 year old nephew last week who was sick with a URI.  She then notes this past Monday she started to have a runny nose which progressed to a nasal congestion.  She has been using saline nasal spray and a netty pot which she feels has not been helpful.  She has noted that she now has progressed this morning to some nasuea. She reports she has been staying hydrated.  She feels like she is having a harder time breathing and has a history of asthma but is not having wheezing she feels this is due having to breath thru her mouth.  She reports her max temp at home is 99.  She did get the flu shot this year. Additionally she notes that she has some itchy eyes. She reports she has tried oral antihistamines in the past, does not remember which one but didn't particularly like it.  Past Medical History  Diagnosis Date  . Anxiety   . Asthma     No PFT in records   . 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 Neck pain, chronic 10/01/2012  . Fibroids   . Test anxiety 10/01/2012  . GERD (gastroesophageal reflux disease)    Current Outpatient Prescriptions  Medication Sig Dispense Refill  . acetaminophen (TYLENOL) 500 MG tablet Take 500 mg by mouth every 6 (six) hours as needed.    Marland Kitchen albuterol (PROVENTIL HFA) 108 (90 BASE) MCG/ACT inhaler Inhale 2 puffs into the lungs 5-30 minutes prior to exercise. 6.7 g 3  . sodium chloride (OCEAN) 0.65 % SOLN nasal spray Place 2 sprays into both nostrils as needed for congestion. 1 Bottle 0  . azelastine (ASTELIN) 0.1 % nasal spray Place 2 sprays into both nostrils 2 (two) times daily.  Use in each nostril as directed 30 mL 2  . ibuprofen (ADVIL,MOTRIN) 200 MG tablet Take 600 mg by mouth every 6 (six) hours as needed. Take as needed for muscle tension, pain      No current facility-administered medications for this visit.   Family History  Problem Relation Age of Onset  . Asthma Son   . Cancer Maternal Aunt     Uterine cancer   . Cancer Maternal Grandmother     Lymphoma   . Sudden death Neg Hx   . Hypertension Neg Hx   . Heart attack Neg Hx   . Hyperlipidemia Neg Hx   . Diabetes Neg Hx    History   Social History  . Marital Status: Married    Spouse Name: N/A    Number of Children: N/A  . Years of Education: N/A   Social History Main Topics  . Smoking status: Former Smoker    Types: Cigarettes    Quit date: 03/26/2011  . Smokeless tobacco: Never Used  . Alcohol Use: 1.2 oz/week    2 Glasses of wine per week     Comment: wine seldom  . Drug Use: No  . Sexual Activity: Yes    Birth Control/ Protection: None   Other Topics Concern  . None  Social History Narrative   Review of Systems: Per HPI  Objective:  Physical Exam: Filed Vitals:   08/20/14 1559  BP: 111/71  Pulse: 74  Temp: 97.8 F (36.6 C)  TempSrc: Oral  Height: 5\' 5"  (1.651 m)  Weight: 194 lb (87.998 kg)  SpO2: 100%  Physical Exam  Constitutional: She is well-developed, well-nourished, and in no distress.  HENT:  Inferior turbinate hypertrophy R>L Mild erythema of posterior oropharnx with midly larger right tonsil Minimal enlargement of first cephlad anterior cervical lymphnode on the right  Cardiovascular: Normal rate and regular rhythm.   Pulmonary/Chest: Effort normal and breath sounds normal. No respiratory distress. She has no wheezes. She has no rales.  Abdominal: Soft. Bowel sounds are normal. She exhibits no distension. There is no tenderness.  Musculoskeletal: She exhibits no edema.  Nursing note and vitals reviewed.   Assessment & Plan:  Case discussed with Dr.  Lynnae January  URI (upper respiratory infection) Checked Upreg (neg) to rule out pregnancy (patient reports no contraception, is not trying to get pregnant but would not mind, is sexually active, she does note that she takes a daily MVI, I recommend she switch to a prenatal) - Suspect Viral URI - Symptomatic relief - does not like oral antihistamines will try Astelin nasal spray to help congestion.  No cough, no sore throat.    Medications Ordered Meds ordered this encounter  Medications  . acetaminophen (TYLENOL) 500 MG tablet    Sig: Take 500 mg by mouth every 6 (six) hours as needed.  Marland Kitchen azelastine (ASTELIN) 0.1 % nasal spray    Sig: Place 2 sprays into both nostrils 2 (two) times daily. Use in each nostril as directed    Dispense:  30 mL    Refill:  2   Other Orders Orders Placed This Encounter  Procedures  . POCT Urine Pregnancy

## 2014-08-20 NOTE — Patient Instructions (Signed)
1. Drink plenty of fluid and rest well. 2. Please take OTC Tylenol for fever and pain 3. Call the clinic if you have fever, SOB, worsening of cough, sputum or other current symptoms.  4. Follow up in 7-10 days if not better.  General Instructions: Try Astelin nasal spray to help with your congestion.  If this does not work you can try OTC nasal steroids.  Please bring your medicines with you each time you come to clinic.  Medicines may include prescription medications, over-the-counter medications, herbal remedies, eye drops, vitamins, or other pills.   Progress Toward Treatment Goals:  No flowsheet data found.  Self Care Goals & Plans:  No flowsheet data found.  No flowsheet data found.   Care Management & Community Referrals:  Referral 02/19/2013  Referrals made to [facility]

## 2014-08-20 NOTE — Assessment & Plan Note (Addendum)
Checked Upreg (neg) to rule out pregnancy (patient reports no contraception, is not trying to get pregnant but would not mind, is sexually active, she does note that she takes a daily MVI, I recommend she switch to a prenatal) - Suspect Viral URI - Symptomatic relief - does not like oral antihistamines will try Astelin nasal spray to help congestion.  No cough, no sore throat.

## 2014-08-23 NOTE — Progress Notes (Signed)
[facility] Attending  Case discussed with Dr. Hoffman soon after the resident saw the patient.  We reviewed the resident's history and exam and pertinent patient test results.  I agree with the assessment, diagnosis, and plan of care documented in the resident's note. 

## 2014-09-09 ENCOUNTER — Emergency Department (HOSPITAL_BASED_OUTPATIENT_CLINIC_OR_DEPARTMENT_OTHER)
Admission: EM | Admit: 2014-09-09 | Discharge: 2014-09-09 | Disposition: A | Discharge status: Home / Self Care | Payer: BLUE CROSS/BLUE SHIELD | Attending: Emergency Medicine | Admitting: Emergency Medicine

## 2014-09-09 ENCOUNTER — Emergency Department (HOSPITAL_BASED_OUTPATIENT_CLINIC_OR_DEPARTMENT_OTHER): Payer: BLUE CROSS/BLUE SHIELD

## 2014-09-09 ENCOUNTER — Encounter (HOSPITAL_BASED_OUTPATIENT_CLINIC_OR_DEPARTMENT_OTHER): Payer: Self-pay | Admitting: *Deleted

## 2014-09-09 DIAGNOSIS — Z880 Allergy status to penicillin: Secondary | ICD-10-CM | POA: Diagnosis not present

## 2014-09-09 DIAGNOSIS — Z79899 Other long term (current) drug therapy: Secondary | ICD-10-CM | POA: Diagnosis not present

## 2014-09-09 DIAGNOSIS — R0789 Other chest pain: Secondary | ICD-10-CM | POA: Diagnosis not present

## 2014-09-09 DIAGNOSIS — J45909 Unspecified asthma, uncomplicated: Secondary | ICD-10-CM | POA: Insufficient documentation

## 2014-09-09 DIAGNOSIS — Z8719 Personal history of other diseases of the digestive system: Secondary | ICD-10-CM | POA: Insufficient documentation

## 2014-09-09 DIAGNOSIS — Z8742 Personal history of other diseases of the female genital tract: Secondary | ICD-10-CM | POA: Insufficient documentation

## 2014-09-09 DIAGNOSIS — Z8659 Personal history of other mental and behavioral disorders: Secondary | ICD-10-CM | POA: Insufficient documentation

## 2014-09-09 DIAGNOSIS — Z8739 Personal history of other diseases of the musculoskeletal system and connective tissue: Secondary | ICD-10-CM | POA: Diagnosis not present

## 2014-09-09 DIAGNOSIS — Z3202 Encounter for pregnancy test, result negative: Secondary | ICD-10-CM | POA: Insufficient documentation

## 2014-09-09 DIAGNOSIS — J069 Acute upper respiratory infection, unspecified: Secondary | ICD-10-CM | POA: Insufficient documentation

## 2014-09-09 DIAGNOSIS — R079 Chest pain, unspecified: Secondary | ICD-10-CM | POA: Diagnosis present

## 2014-09-09 DIAGNOSIS — Z87891 Personal history of nicotine dependence: Secondary | ICD-10-CM | POA: Insufficient documentation

## 2014-09-09 LAB — CBC WITH DIFFERENTIAL/PLATELET
Basophils Absolute: 0.1 10*3/uL (ref 0.0–0.1)
Basophils Relative: 1 % (ref 0–1)
EOS PCT: 5 % (ref 0–5)
Eosinophils Absolute: 0.4 10*3/uL (ref 0.0–0.7)
HCT: 41.4 % (ref 36.0–46.0)
Hemoglobin: 13.9 g/dL (ref 12.0–15.0)
Lymphocytes Relative: 23 % (ref 12–46)
Lymphs Abs: 1.9 10*3/uL (ref 0.7–4.0)
MCH: 32 pg (ref 26.0–34.0)
MCHC: 33.6 g/dL (ref 30.0–36.0)
MCV: 95.2 fL (ref 78.0–100.0)
MONOS PCT: 7 % (ref 3–12)
Monocytes Absolute: 0.6 10*3/uL (ref 0.1–1.0)
NEUTROS ABS: 5.3 10*3/uL (ref 1.7–7.7)
NEUTROS PCT: 64 % (ref 43–77)
Platelets: 315 10*3/uL (ref 150–400)
RBC: 4.35 MIL/uL (ref 3.87–5.11)
RDW: 12.3 % (ref 11.5–15.5)
WBC: 8.1 10*3/uL (ref 4.0–10.5)

## 2014-09-09 LAB — COMPREHENSIVE METABOLIC PANEL
ALBUMIN: 3.8 g/dL (ref 3.5–5.2)
ALK PHOS: 60 U/L (ref 39–117)
ALT: 25 U/L (ref 0–35)
AST: 19 U/L (ref 0–37)
Anion gap: 4 — ABNORMAL LOW (ref 5–15)
BUN: 11 mg/dL (ref 6–23)
CHLORIDE: 105 mmol/L (ref 96–112)
CO2: 25 mmol/L (ref 19–32)
Calcium: 8.9 mg/dL (ref 8.4–10.5)
Creatinine, Ser: 0.76 mg/dL (ref 0.50–1.10)
GFR calc non Af Amer: >90 mL/min (ref >90)
GLUCOSE: 98 mg/dL (ref 70–99)
POTASSIUM: 3.7 mmol/L (ref 3.5–5.1)
Sodium: 134 mmol/L — ABNORMAL LOW (ref 135–145)
Total Bilirubin: 0.5 mg/dL (ref 0.3–1.2)
Total Protein: 7.2 g/dL (ref 6.0–8.3)

## 2014-09-09 LAB — TROPONIN I: Troponin I: <0.03 ng/mL (ref <0.031)

## 2014-09-09 LAB — PREGNANCY, URINE: PREG TEST UR: NEGATIVE

## 2014-09-09 LAB — LIPASE, BLOOD: Lipase: 42 U/L (ref 11–59)

## 2014-09-09 MED ORDER — ALBUTEROL SULFATE HFA 108 (90 BASE) MCG/ACT IN AERS: 2.0000 | INHALATION_SPRAY | RESPIRATORY_TRACT | Status: DC | PRN

## 2014-09-09 MED ORDER — ONDANSETRON 4 MG PO TBDP: 4.0000 mg | ORAL_TABLET | Freq: Three times a day (TID) | ORAL | Status: DC | PRN

## 2014-09-09 MED ORDER — AZITHROMYCIN 250 MG PO TABS: 250.0000 mg | ORAL_TABLET | Freq: Every day | ORAL | Status: DC

## 2014-09-09 MED ORDER — PREDNISONE 20 MG PO TABS: 60.0000 mg | ORAL_TABLET | Freq: Every day | ORAL | Status: DC

## 2014-09-09 MED ORDER — IBUPROFEN 800 MG PO TABS: 800.0000 mg | ORAL_TABLET | Freq: Three times a day (TID) | ORAL | Status: AC | PRN

## 2014-09-09 MED ORDER — GUAIFENESIN-CODEINE 100-10 MG/5ML PO SOLN: 5.0000 mL | ORAL | Status: DC | PRN

## 2014-09-09 NOTE — Discharge Instructions (Signed)
Chest Wall Pain Chest wall pain is pain in or around the bones and muscles of your chest. It may take up to 6 weeks to get better. It may take longer if you must stay physically active in your work and activities.  CAUSES  Chest wall pain may happen on its own. However, it may be caused by:  A viral illness like the flu.  Injury.  Coughing.  Exercise.  Arthritis.  Fibromyalgia.  Shingles. HOME CARE INSTRUCTIONS   Avoid overtiring physical activity. Try not to strain or perform activities that cause pain. This includes any activities using your chest or your abdominal and side muscles, especially if heavy weights are used.  Put ice on the sore area.  Put ice in a plastic bag.  Place a towel between your skin and the bag.  Leave the ice on for 15-20 minutes per hour while awake for the first 2 days.  Only take over-the-counter or prescription medicines for pain, discomfort, or fever as directed by your caregiver. SEEK IMMEDIATE MEDICAL CARE IF:   Your pain increases, or you are very uncomfortable.  You have a fever.  Your chest pain becomes worse.  You have new, unexplained symptoms.  You have nausea or vomiting.  You feel sweaty or lightheaded.  You have a cough with phlegm (sputum), or you cough up blood. MAKE SURE YOU:   Understand these instructions.  Will watch your condition.  Will get help right away if you are not doing well or get worse. Document Released: 07/30/2005 Document Revised: 10/22/2011 Document Reviewed: 03/26/2011 [facility] Patient Information 2015 [place], [place]. This information is not intended to replace advice given to you by your health care provider. Make sure you discuss any questions you have with your health care provider.  Upper Respiratory Infection, Adult An upper respiratory infection (URI) is also sometimes known as the common cold. The upper respiratory tract includes the nose, sinuses, throat, trachea, and bronchi. Bronchi are  the airways leading to the lungs. Most people improve within 1 week, but symptoms can last up to 2 weeks. A residual cough may last even longer.  CAUSES Many different viruses can infect the tissues lining the upper respiratory tract. The tissues become irritated and inflamed and often become very moist. Mucus production is also common. A cold is contagious. You can easily spread the virus to others by oral contact. This includes kissing, sharing a glass, coughing, or sneezing. Touching your mouth or nose and then touching a surface, which is then touched by another person, can also spread the virus. SYMPTOMS  Symptoms typically develop 1 to 3 days after you come in contact with a cold virus. Symptoms vary from person to person. They may include:  Runny nose.  Sneezing.  Nasal congestion.  Sinus irritation.  Sore throat.  Loss of voice (laryngitis).  Cough.  Fatigue.  Muscle aches.  Loss of appetite.  Headache.  Low-grade fever. DIAGNOSIS  You might diagnose your own cold based on familiar symptoms, since most people get a cold 2 to 3 times a year. Your caregiver can confirm this based on your exam. Most importantly, your caregiver can check that your symptoms are not due to another disease such as strep throat, sinusitis, pneumonia, asthma, or epiglottitis. Blood tests, throat tests, and X-rays are not necessary to diagnose a common cold, but they may sometimes be helpful in excluding other more serious diseases. Your caregiver will decide if any further tests are required. RISKS AND COMPLICATIONS  You  may be at risk for a more severe case of the common cold if you smoke cigarettes, have chronic heart disease (such as heart failure) or lung disease (such as asthma), or if you have a weakened immune system. The very young and very old are also at risk for more serious infections. Bacterial sinusitis, middle ear infections, and bacterial pneumonia can complicate the common cold. The  common cold can worsen asthma and chronic obstructive pulmonary disease (COPD). Sometimes, these complications can require emergency medical care and may be life-threatening. PREVENTION  The best way to protect against getting a cold is to practice good hygiene. Avoid oral or hand contact with people with cold symptoms. Wash your hands often if contact occurs. There is no clear evidence that vitamin C, vitamin E, echinacea, or exercise reduces the chance of developing a cold. However, it is always recommended to get plenty of rest and practice good nutrition. TREATMENT  Treatment is directed at relieving symptoms. There is no cure. Antibiotics are not effective, because the infection is caused by a virus, not by bacteria. Treatment may include:  Increased fluid intake. Sports drinks offer valuable electrolytes, sugars, and fluids.  Breathing heated mist or steam (vaporizer or shower).  Eating chicken soup or other clear broths, and maintaining good nutrition.  Getting plenty of rest.  Using gargles or lozenges for comfort.  Controlling fevers with ibuprofen or acetaminophen as directed by your caregiver.  Increasing usage of your inhaler if you have asthma. Zinc gel and zinc lozenges, taken in the first 24 hours of the common cold, can shorten the duration and lessen the severity of symptoms. Pain medicines may help with fever, muscle aches, and throat pain. A variety of non-prescription medicines are available to treat congestion and runny nose. Your caregiver can make recommendations and may suggest nasal or lung inhalers for other symptoms.  HOME CARE INSTRUCTIONS   Only take over-the-counter or prescription medicines for pain, discomfort, or fever as directed by your caregiver.  Use a warm mist humidifier or inhale steam from a shower to increase air moisture. This may keep secretions moist and make it easier to breathe.  Drink enough water and fluids to keep your urine clear or pale  yellow.  Rest as needed.  Return to work when your temperature has returned to normal or as your caregiver advises. You may need to stay home longer to avoid infecting others. You can also use a face mask and careful hand washing to prevent spread of the virus. SEEK MEDICAL CARE IF:   After the first few days, you feel you are getting worse rather than better.  You need your caregiver's advice about medicines to control symptoms.  You develop chills, worsening shortness of breath, or brown or red sputum. These may be signs of pneumonia.  You develop yellow or brown nasal discharge or pain in the face, especially when you bend forward. These may be signs of sinusitis.  You develop a fever, swollen neck glands, pain with swallowing, or white areas in the back of your throat. These may be signs of strep throat. SEEK IMMEDIATE MEDICAL CARE IF:   You have a fever.  You develop severe or persistent headache, ear pain, sinus pain, or chest pain.  You develop wheezing, a prolonged cough, cough up blood, or have a change in your usual mucus (if you have chronic lung disease).  You develop sore muscles or a stiff neck. Document Released: 01/23/2001 Document Revised: 10/22/2011 Document Reviewed: 11/04/2013 [employer]  Patient Information 2015 [place]. This information is not intended to replace advice given to you by your health care provider. Make sure you discuss any questions you have with your health care provider. Asthma Asthma is a recurring condition in which the airways tighten and narrow. Asthma can make it difficult to breathe. It can cause coughing, wheezing, and shortness of breath. Asthma episodes, also called asthma attacks, range from minor to life-threatening. Asthma cannot be cured, but medicines and lifestyle changes can help control it. CAUSES Asthma is believed to be caused by inherited (genetic) and environmental factors, but its exact cause is unknown. Asthma may be  triggered by allergens, lung infections, or irritants in the air. Asthma triggers are different for each person. Common triggers include:   Animal dander.  Dust mites.  Cockroaches.  Pollen from trees or grass.  Mold.  Smoke.  Air pollutants such as dust, household cleaners, hair sprays, aerosol sprays, paint fumes, strong chemicals, or strong odors.  Cold air, weather changes, and winds (which increase molds and pollens in the air).  Strong emotional expressions such as crying or laughing hard.  Stress.  Certain medicines (such as aspirin) or types of drugs (such as beta-blockers).  Sulfites in foods and drinks. Foods and drinks that may contain sulfites include dried fruit, potato chips, and sparkling grape juice.  Infections or inflammatory conditions such as the flu, a cold, or an inflammation of the nasal membranes (rhinitis).  Gastroesophageal reflux disease (GERD).  Exercise or strenuous activity. SYMPTOMS Symptoms may occur immediately after asthma is triggered or many hours later. Symptoms include:  Wheezing.  Excessive nighttime or early morning coughing.  Frequent or severe coughing with a common cold.  Chest tightness.  Shortness of breath. DIAGNOSIS  The diagnosis of asthma is made by a review of your medical history and a physical exam. Tests may also be performed. These may include:  Lung function studies. These tests show how much air you breathe in and out.  Allergy tests.  Imaging tests such as X-rays. TREATMENT  Asthma cannot be cured, but it can usually be controlled. Treatment involves identifying and avoiding your asthma triggers. It also involves medicines. There are 2 classes of medicine used for asthma treatment:   Controller medicines. These prevent asthma symptoms from occurring. They are usually taken every day.  Reliever or rescue medicines. These quickly relieve asthma symptoms. They are used as needed and provide short-term  relief. Your health care provider will help you create an asthma action plan. An asthma action plan is a written plan for managing and treating your asthma attacks. It includes a list of your asthma triggers and how they may be avoided. It also includes information on when medicines should be taken and when their dosage should be changed. An action plan may also involve the use of a device called a peak flow meter. A peak flow meter measures how well the lungs are working. It helps you monitor your condition. HOME CARE INSTRUCTIONS   Take medicines only as directed by your health care provider. Speak with your health care provider if you have questions about how or when to take the medicines.  Use a peak flow meter as directed by your health care provider. Record and keep track of readings.  Understand and use the action plan to help minimize or stop an asthma attack without needing to seek medical care.  Control your home environment in the following ways to help prevent asthma attacks:  Do  not smoke. Avoid being exposed to secondhand smoke.  Change your heating and air conditioning filter regularly.  Limit your use of fireplaces and wood stoves.  Get rid of pests (such as roaches and mice) and their droppings.  Throw away plants if you see mold on them.  Clean your floors and dust regularly. Use unscented cleaning products.  Try to have someone else vacuum for you regularly. Stay out of rooms while they are being vacuumed and for a short while afterward. If you vacuum, use a dust mask from a hardware store, a double-layered or microfilter vacuum cleaner bag, or a vacuum cleaner with a HEPA filter.  Replace carpet with wood, tile, or vinyl flooring. Carpet can trap dander and dust.  Use allergy-proof pillows, mattress covers, and box spring covers.  Wash bed sheets and blankets every week in hot water and dry them in a dryer.  Use blankets that are made of polyester or  cotton.  Clean bathrooms and kitchens with bleach. If possible, have someone repaint the walls in these rooms with mold-resistant paint. Keep out of the rooms that are being cleaned and painted.  Wash hands frequently. SEEK MEDICAL CARE IF:   You have wheezing, shortness of breath, or a cough even if taking medicine to prevent attacks.  The colored mucus you cough up (sputum) is thicker than usual.  Your sputum changes from clear or white to yellow, green, gray, or bloody.  You have any problems that may be related to the medicines you are taking (such as a rash, itching, swelling, or trouble breathing).  You are using a reliever medicine more than 2-3 times per week.  Your peak flow is still at 50-79% of your personal best after following your action plan for 1 hour.  You have a fever. SEEK IMMEDIATE MEDICAL CARE IF:   You seem to be getting worse and are unresponsive to treatment during an asthma attack.  You are short of breath even at rest.  You get short of breath when doing very little physical activity.  You have difficulty eating, drinking, or talking due to asthma symptoms.  You develop chest pain.  You develop a fast heartbeat.  You have a bluish color to your lips or fingernails.  You are light-headed, dizzy, or faint.  Your peak flow is less than 50% of your personal best. MAKE SURE YOU:   Understand these instructions.  Will watch your condition.  Will get help right away if you are not doing well or get worse. Document Released: 07/30/2005 Document Revised: 12/14/2013 Document Reviewed: 02/26/2013 [facility] Patient Information 2015 [place], [place]. This information is not intended to replace advice given to you by your health care provider. Make sure you discuss any questions you have with your health care provider.

## 2014-09-09 NOTE — ED Notes (Signed)
MD at bedside. 

## 2014-09-09 NOTE — ED Notes (Signed)
Patient transported to X-ray 

## 2014-09-09 NOTE — ED Notes (Signed)
Pt sts she was studying last night when she developed a tightness in her central chest radiating to her back and shoulders.

## 2014-09-09 NOTE — ED Provider Notes (Addendum)
TIME SEEN: 10:15 AM  CHIEF COMPLAINT: Chest pain  HPI: Patient is a 39 year old female with history of asthma, anxiety who presents to the emergency department with complaints of substernal chest tightness that started last night and has been constant. No associated shortness of breath. States she has had intermittent nausea but no vomiting. No diaphoresis or dizziness. She has recently had subjective fevers, cough with green sputum production over the past several weeks but this hasn't been improving. No wheezing. No history of cardiac disease. No history of hypertension, diabetes, hyperlipidemia, current tobacco use. No history of PE or DVT, recent surgery, long flight, hospitalization, trauma, exogenous estrogen use. No lower extremity swelling or pain. Pain is not pleuritic or exertional.  ROS: See HPI Constitutional: no fever  Eyes: no drainage  ENT: no runny nose   Cardiovascular:   chest pain  Resp: no SOB  GI: no vomiting GU: no dysuria Integumentary: no rash  Allergy: no hives  Musculoskeletal: no leg swelling  Neurological: no slurred speech ROS otherwise negative  PAST MEDICAL HISTORY/PAST SURGICAL HISTORY:  Past Medical History  Diagnosis Date  . Anxiety   . Asthma     No PFT in records   . 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 Neck pain, chronic 10/01/2012  . Fibroids   . Test anxiety 10/01/2012  . GERD (gastroesophageal reflux disease)     MEDICATIONS:  Prior to Admission medications   Medication Sig Start Date End Date Taking? Authorizing Provider  acetaminophen (TYLENOL) 500 MG tablet Take 500 mg by mouth every 6 (six) hours as needed.    Historical Provider, MD  albuterol (PROVENTIL HFA) 108 (90 BASE) MCG/ACT inhaler Inhale 2 puffs into the lungs 5-30 minutes prior to exercise. 02/19/14   Francesca Oman, DO  azelastine (ASTELIN) 0.1 % nasal spray Place 2 sprays into both nostrils 2 (two) times daily. Use in each nostril as directed 08/20/14 08/20/15  Lucious Groves, DO   ibuprofen (ADVIL,MOTRIN) 200 MG tablet Take 600 mg by mouth every 6 (six) hours as needed. Take as needed for muscle tension, pain     Historical Provider, MD  sodium chloride (OCEAN) 0.65 % SOLN nasal spray Place 2 sprays into both nostrils as needed for congestion. 02/11/14   Malena Catholic, MD    ALLERGIES:  Allergies  Allergen Reactions  . Penicillins Hives and Swelling    Has reaction "with all cillins"    SOCIAL HISTORY:  History  Substance Use Topics  . Smoking status: Former Smoker    Types: Cigarettes    Quit date: 03/26/2011  . Smokeless tobacco: Never Used  . Alcohol Use: 1.2 oz/week    2 Glasses of wine per week     Comment: wine seldom    FAMILY HISTORY: Family History  Problem Relation Age of Onset  . Asthma Son   . Cancer Maternal Aunt     Uterine cancer   . Cancer Maternal Grandmother     Lymphoma   . Sudden death Neg Hx   . Hypertension Neg Hx   . Heart attack Neg Hx   . Hyperlipidemia Neg Hx   . Diabetes Neg Hx     EXAM: BP 119/80 mmHg  Pulse 91  Temp(Src) 98.4 F (36.9 C) (Oral)  Resp 18  Ht 5\' 5"  (1.651 m)  Wt 190 lb (86.183 kg)  BMI 31.62 kg/m2  SpO2 100%  LMP 08/23/2014 CONSTITUTIONAL: Alert and oriented and responds appropriately to questions. Well-appearing; well-nourished HEAD: Normocephalic EYES: Conjunctivae  clear, PERRL ENT: normal nose; no rhinorrhea; moist mucous membranes; pharynx without lesions noted NECK: Supple, no meningismus, no LAD  CARD: RRR; S1 and S2 appreciated; no murmurs, no clicks, no rubs, no gallops RESP: Normal chest excursion without splinting or tachypnea; breath sounds clear and equal bilaterally; no wheezes, no rhonchi, no rales, tender to palpation over the sternum without crepitus or ecchymosis or deformity ABD/GI: Normal bowel sounds; non-distended; soft, non-tender, no rebound, no guarding BACK:  The back appears normal and is non-tender to palpation, there is no CVA tenderness EXT: Normal ROM in  all joints; non-tender to palpation; no edema; normal capillary refill; no cyanosis; no Tenderness or swelling   SKIN: Normal color for age and race; warm NEURO: Moves all extremities equally PSYCH: The patient's mood and manner are appropriate. Grooming and personal hygiene are appropriate.  MEDICAL DECISION MAKING: Patient here with chest pain that is likely chest wall pain and she is very tender to palpation over the sternum. Have offered anti-inflammatories which she declines at this time. EKG is nonischemic. She has no risk factors for pulmonary embolus and is PERC negative.  Patient does appear very anxious which may also contribute to her symptoms. We'll obtain basic labs, troponin, chest x-ray. If workup unremarkable anticipate discharge home.  ED PROGRESS: Patient history hemodynamically stable. Her labs are unremarkable including negative troponin, normal CBC, BMP, lipase, LFTs. Chest x-ray clear. Given she does have a history of asthma and has had productive cough with green sputum will discharge on azithromycin with prednisone burst, albuterol inhaler. Will give ibuprofen for chest wall pain. Discussed importance of PCP follow-up if symptoms worsen. Discussed return precautions. She verbalized understanding and is comfortable with plan. Given pain is been constant since last night and she has no risk factors for ACS I do not feel she needs serial sets of cardiac enzymes or admission.      EKG Interpretation  Date/Time:  Thursday September 09 2014 09:54:09 EST Ventricular Rate:  76 PR Interval:  166 QRS Duration: 102 QT Interval:  358 QTC Calculation: 402 R Axis:   52 Text Interpretation:  Normal sinus rhythm with sinus arrhythmia Normal ECG Confirmed by Isac Lincks,  DO, Emilianna Barlowe ([id number]) on 09/09/2014 10:02:47 AM        Delice Bison Lesleigh Hughson, DO 09/09/14 [place], DO 09/09/14 [id number]

## 2014-11-26 ENCOUNTER — Ambulatory Visit: Payer: BLUE CROSS/BLUE SHIELD | Admitting: Internal Medicine

## 2015-01-05 ENCOUNTER — Ambulatory Visit: Payer: BLUE CROSS/BLUE SHIELD | Admitting: Obstetrics & Gynecology

## 2015-03-03 NOTE — Telephone Encounter (Signed)
error 

## 2015-04-06 ENCOUNTER — Ambulatory Visit: Payer: BLUE CROSS/BLUE SHIELD | Admitting: Obstetrics & Gynecology

## 2015-04-15 ENCOUNTER — Ambulatory Visit: Payer: Self-pay | Admitting: Obstetrics and Gynecology

## 2015-04-25 ENCOUNTER — Ambulatory Visit: Payer: Self-pay | Admitting: Obstetrics & Gynecology

## 2015-05-12 ENCOUNTER — Ambulatory Visit: Payer: Self-pay | Admitting: Obstetrics & Gynecology

## 2015-05-18 ENCOUNTER — Encounter: Payer: Self-pay | Admitting: Obstetrics & Gynecology

## 2015-05-18 ENCOUNTER — Ambulatory Visit (INDEPENDENT_AMBULATORY_CARE_PROVIDER_SITE_OTHER): Payer: Commercial Managed Care - PPO | Admitting: Obstetrics & Gynecology

## 2015-05-18 VITALS — BP 120/74 | HR 72 | Temp 98.4°F | Ht 65.0 in | Wt 193.3 lb

## 2015-05-18 DIAGNOSIS — Z124 Encounter for screening for malignant neoplasm of cervix: Secondary | ICD-10-CM

## 2015-05-18 DIAGNOSIS — N898 Other specified noninflammatory disorders of vagina: Secondary | ICD-10-CM | POA: Diagnosis not present

## 2015-05-18 DIAGNOSIS — Z01419 Encounter for gynecological examination (general) (routine) without abnormal findings: Secondary | ICD-10-CM

## 2015-05-18 DIAGNOSIS — Z0000 Encounter for general adult medical examination without abnormal findings: Secondary | ICD-10-CM

## 2015-05-18 DIAGNOSIS — Z1151 Encounter for screening for human papillomavirus (HPV): Secondary | ICD-10-CM

## 2015-05-18 MED ORDER — METRONIDAZOLE 500 MG PO TABS: 500.0000 mg | ORAL_TABLET | Freq: Two times a day (BID) | ORAL | Status: DC

## 2015-05-18 NOTE — Progress Notes (Signed)
Subjective:    Susan Brewer is a 39 y.o. MW P1 (81 yo son)  female who presents for an annual exam. The patient has no complaints today. She would like to conceive. She is on MVIs daily The patient is sexually active. GYN screening history: last pap: was normal. The patient wears seatbelts: yes. The patient participates in regular exercise: no. Has the patient ever been transfused or tattooed?: yes. The patient reports that there is not domestic violence in her life.   Menstrual History: OB History    Gravida Para Term Preterm AB TAB SAB Ectopic Multiple Living   2 1 1  1   1  1       Menarche age: 14  Patient's last menstrual period was 04/25/2015.    The following portions of the patient's history were reviewed and updated as appropriate: allergies, current medications, past family history, past medical history, past social history, past surgical history and problem list.  Review of Systems Pertinent items noted in HPI and remainder of comprehensive ROS otherwise negative. Married for a year. Normal HSG after ectopic pregnancy.    Objective:    BP 120/74 mmHg  Pulse 72  Temp(Src) 98.4 F (36.9 C)  Ht 5\' 5"  (1.651 m)  Wt 193 lb 4.8 oz (87.68 kg)  BMI 32.17 kg/m2  LMP 04/25/2015  General Appearance:    Alert, cooperative, no distress, appears stated age  Head:    Normocephalic, without obvious abnormality, atraumatic  Eyes:    PERRL, conjunctiva/corneas clear, EOM's intact, fundi    benign, both eyes  Ears:    Normal TM's and external ear canals, both ears  Nose:   Nares normal, septum midline, mucosa normal, no drainage    or sinus tenderness  Throat:   Lips, mucosa, and tongue normal; teeth and gums normal  Neck:   Supple, symmetrical, trachea midline, no adenopathy;    thyroid:  no enlargement/tenderness/nodules; no carotid   bruit or JVD  Back:     Symmetric, no curvature, ROM normal, no CVA tenderness  Lungs:     Clear to auscultation bilaterally, respirations unlabored   Chest Wall:    No tenderness or deformity   Heart:    Regular rate and rhythm, S1 and S2 normal, no murmur, rub   or gallop  Breast Exam:    No tenderness, masses, or nipple abnormality  Abdomen:     Soft, non-tender, bowel sounds active all four quadrants,    no masses, no organomegaly  Genitalia:    Normal female without lesion, discharge or tenderness, NSSA, NT, mobile, normal adnexal exam, discharge c/w BV     Extremities:   Extremities normal, atraumatic, no cyanosis or edema  Pulses:   2+ and symmetric all extremities  Skin:   Skin color, texture, turgor normal, no rashes or lesions  Lymph nodes:   Cervical, supraclavicular, and axillary nodes normal  Neurologic:   CNII-XII intact, normal strength, sensation and reflexes    throughout  .    Assessment:    Healthy female exam.    Plan:     Breast self exam technique reviewed and patient encouraged to perform self-exam monthly. Thin prep Pap smear. with cotesting Continue MVIs

## 2015-05-20 LAB — CYTOLOGY - PAP

## 2015-05-26 ENCOUNTER — Telehealth: Payer: Self-pay | Admitting: *Deleted

## 2015-05-26 LAB — CERVICOVAGINAL ANCILLARY ONLY

## 2015-05-26 NOTE — Telephone Encounter (Signed)
Called patient and informed her of normal pap results. Patient had no further questions.

## 2016-01-05 IMAGING — CR DG CHEST 2V
2 series · 2 of 2 positions shown · non-contrast
Comparison: September 19, 2009.

CLINICAL DATA: Acute chest pain.

EXAM:
CHEST  2 VIEW

[w chest pa]
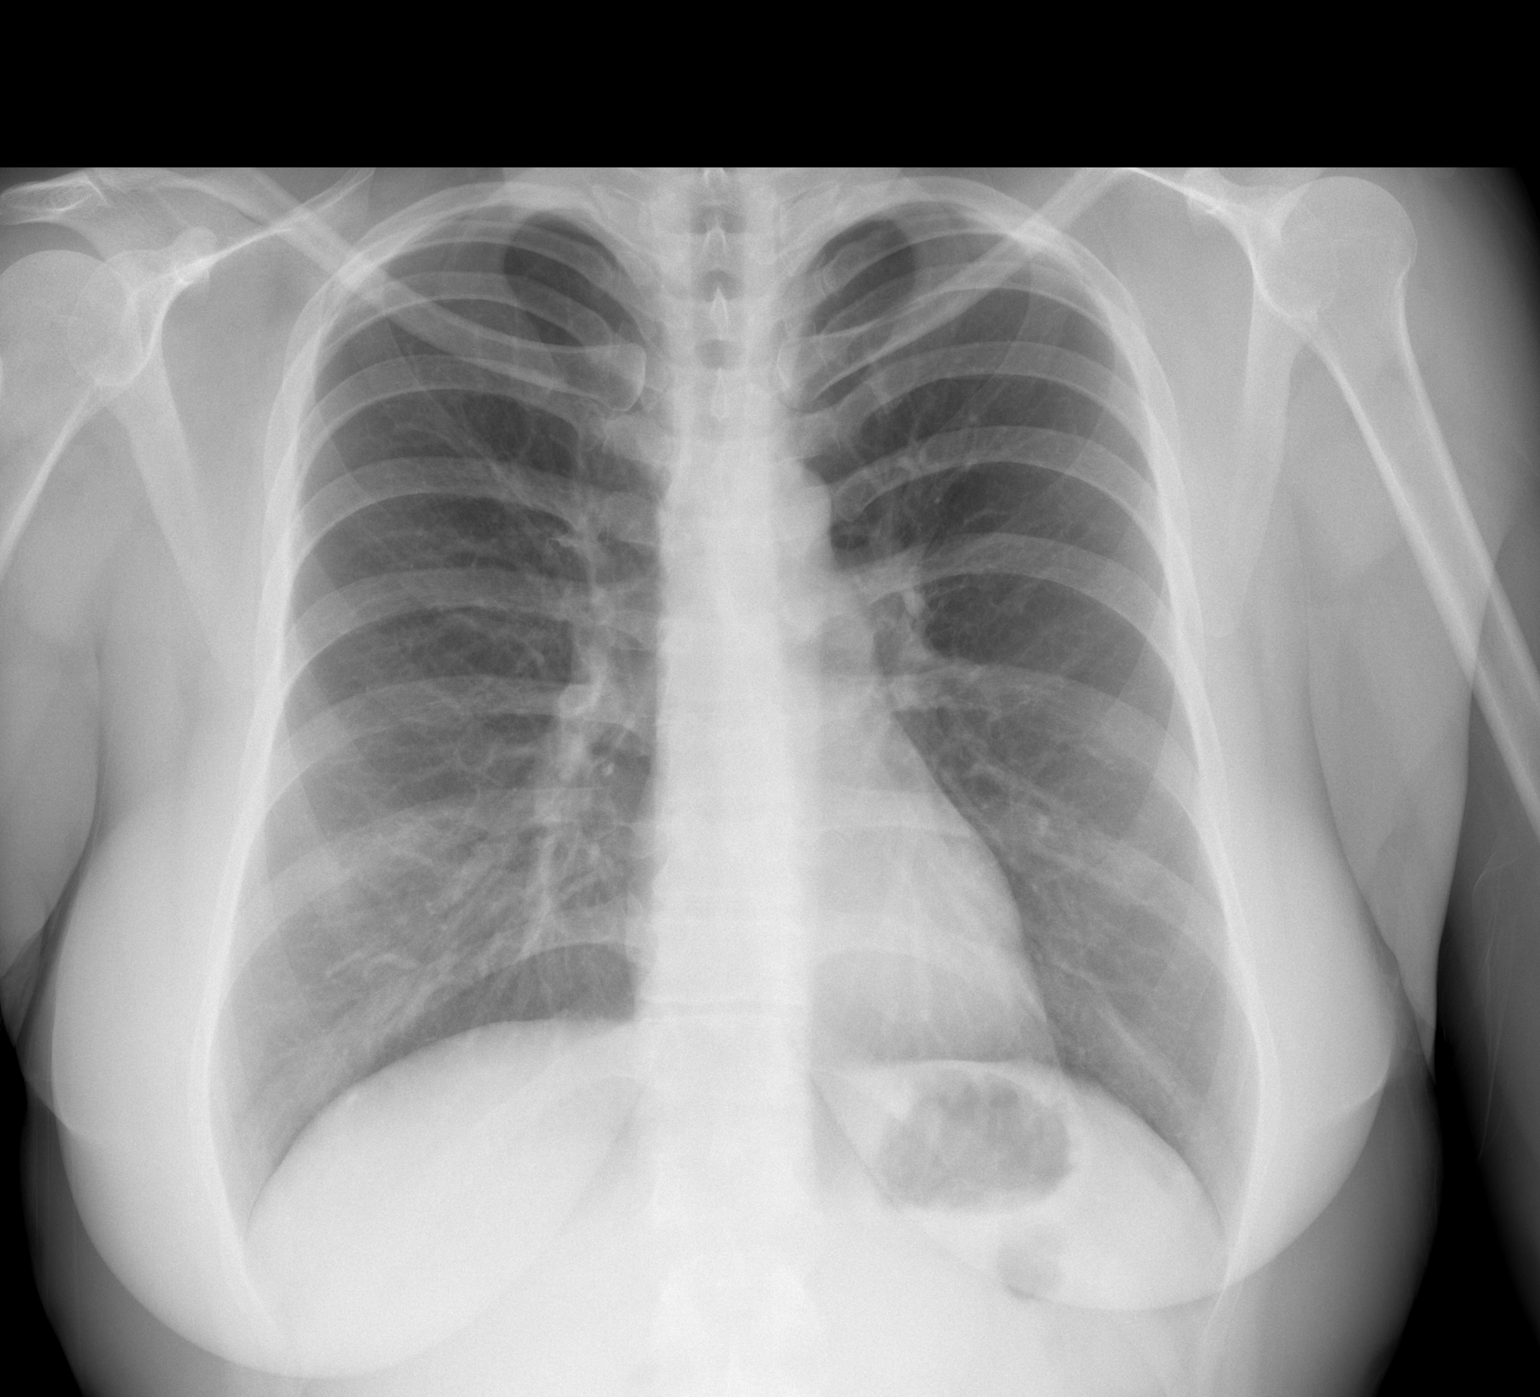

[w chest lat]
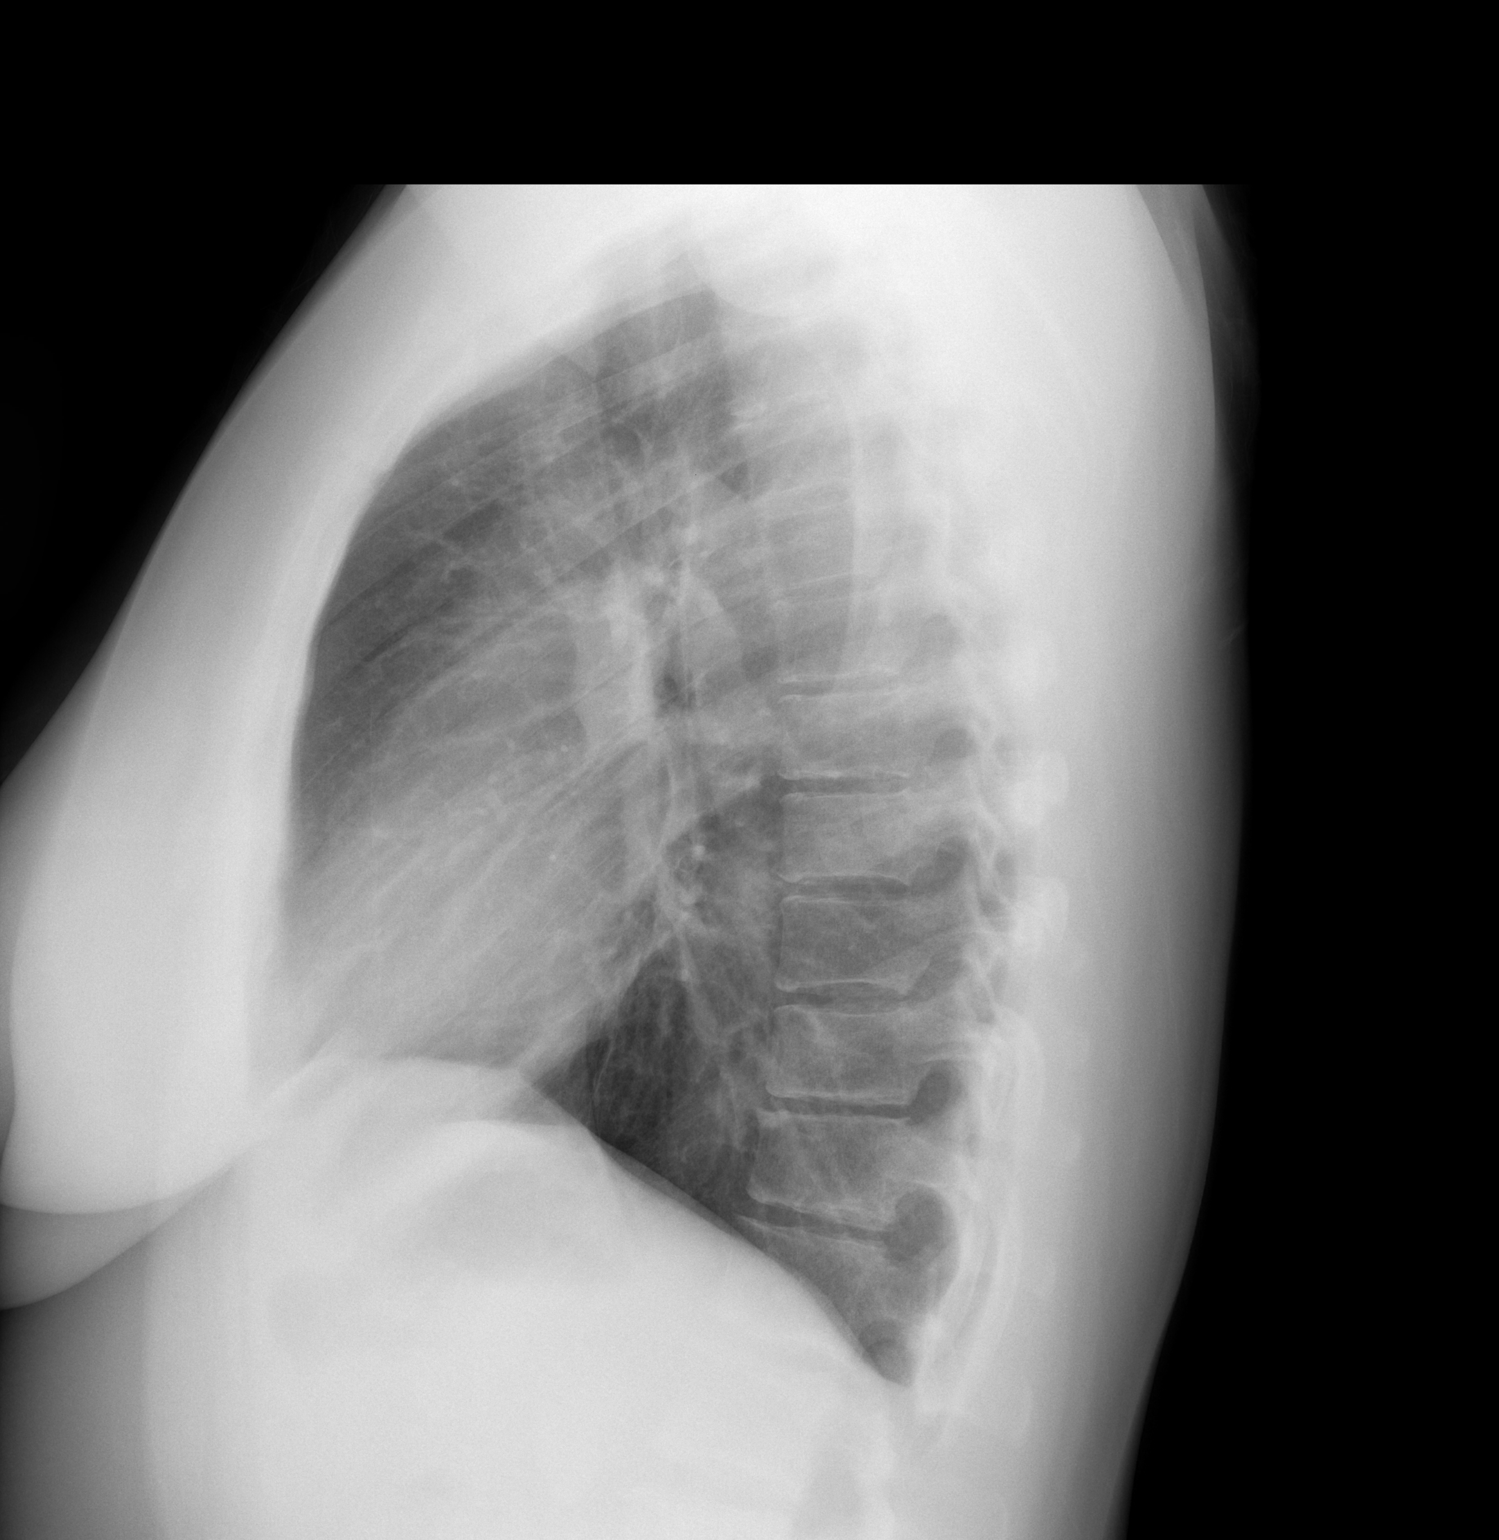

[2 of 2 positions shown; findings below may reference images not displayed]

FINDINGS: The heart size and mediastinal contours are within normal limits.
Both lungs are clear. No pneumothorax or pleural effusion is noted.
The visualized skeletal structures are unremarkable.
IMPRESSION: No acute cardiopulmonary abnormality seen.

## 2016-05-01 ENCOUNTER — Ambulatory Visit (INDEPENDENT_AMBULATORY_CARE_PROVIDER_SITE_OTHER): Payer: Commercial Managed Care - PPO | Admitting: Obstetrics & Gynecology

## 2016-05-01 ENCOUNTER — Encounter: Payer: Self-pay | Admitting: Obstetrics & Gynecology

## 2016-05-01 ENCOUNTER — Encounter (INDEPENDENT_AMBULATORY_CARE_PROVIDER_SITE_OTHER): Payer: Self-pay

## 2016-05-01 VITALS — BP 104/70 | HR 72 | Ht 65.0 in | Wt 204.0 lb

## 2016-05-01 DIAGNOSIS — Z124 Encounter for screening for malignant neoplasm of cervix: Secondary | ICD-10-CM

## 2016-05-01 DIAGNOSIS — D259 Leiomyoma of uterus, unspecified: Secondary | ICD-10-CM | POA: Diagnosis not present

## 2016-05-01 DIAGNOSIS — Z1151 Encounter for screening for human papillomavirus (HPV): Secondary | ICD-10-CM

## 2016-05-01 DIAGNOSIS — Z01419 Encounter for gynecological examination (general) (routine) without abnormal findings: Secondary | ICD-10-CM | POA: Diagnosis not present

## 2016-05-01 NOTE — Progress Notes (Signed)
Subjective:    Susan Brewer is a 40 y.o. MH P6 (40 yo) female who presents for an annual exam. The patient has no complaints today. The patient is sexually active. GYN screening history: last pap: was normal. The patient wears seatbelts: yes. The patient participates in regular exercise: yes. Has the patient ever been transfused or tattooed?: yes. The patient reports that there is not domestic violence in her life.   Menstrual History: OB History    Gravida Para Term Preterm AB Living   2 1 1   1 1    SAB TAB Ectopic Multiple Live Births       1          Menarche age: 54 Patient's last menstrual period was 04/24/2016 (approximate).    The following portions of the patient's history were reviewed and updated as appropriate: allergies, current medications, past family history, past medical history, past social history, past surgical history and problem list.  Review of Systems Pertinent items are noted in HPI. 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 Aunt with uterine cancer, but no breast or colon cancer in [place] Husband has 1 testicle (had one removed in the distant past).   Objective:    BP 104/70   Pulse 72   Ht 5\' 5"  (1.651 m)   Wt 204 lb (92.5 kg)   LMP 04/24/2016 (Approximate)   BMI 33.95 kg/m   General Appearance:    Alert, cooperative, no distress, appears stated age  Head:    Normocephalic, without obvious abnormality, atraumatic  Eyes:    PERRL, conjunctiva/corneas clear, EOM's intact, fundi    benign, both eyes  Ears:    Normal TM's and external ear canals, both ears  Nose:   Nares normal, septum midline, mucosa normal, no drainage    or sinus tenderness  Throat:   Lips, mucosa, and tongue normal; teeth and gums normal  Neck:   Supple, symmetrical, trachea midline, no adenopathy;    thyroid:  no enlargement/tenderness/nodules; no carotid   bruit or JVD  Back:     Symmetric, no curvature, ROM normal, no CVA tenderness  Lungs:     Clear to auscultation  bilaterally, respirations unlabored  Chest Wall:    No tenderness or deformity   Heart:    Regular rate and rhythm, S1 and S2 normal, no murmur, rub   or gallop  Breast Exam:    No tenderness, masses, or nipple abnormality  Abdomen:     Soft, non-tender, bowel sounds active all four quadrants,    no masses, no organomegaly  Genitalia:    Normal female without lesion, discharge or tenderness, NSSR, possibly a very small posterior fibroid, no palpable adnexal massses     Extremities:   Extremities normal, atraumatic, no cyanosis or edema  Pulses:   2+ and symmetric all extremities  Skin:   Skin color, texture, turgor normal, no rashes or lesions  Lymph nodes:   Cervical, supraclavicular, and axillary nodes normal  Neurologic:   CNII-XII intact, normal strength, sensation and reflexes    throughout   .    Assessment:    Healthy female exam.   H/o fibroids Skipped a few periods   Plan:     Thin prep Pap smear. with cotesting Gyn u/s Check TSH, vit D

## 2016-05-02 LAB — TSH: TSH: 2.07 mIU/L

## 2016-05-02 LAB — VITAMIN D 25 HYDROXY (VIT D DEFICIENCY, FRACTURES): Vit D, 25-Hydroxy: 34 ng/mL (ref 30–100)

## 2016-05-03 LAB — CYTOLOGY - PAP

## 2016-05-10 ENCOUNTER — Other Ambulatory Visit: Payer: Commercial Managed Care - PPO

## 2017-03-05 ENCOUNTER — Encounter: Payer: Self-pay | Admitting: Obstetrics & Gynecology

## 2017-03-05 ENCOUNTER — Ambulatory Visit (INDEPENDENT_AMBULATORY_CARE_PROVIDER_SITE_OTHER): Payer: Commercial Managed Care - PPO | Admitting: Obstetrics & Gynecology

## 2017-03-05 VITALS — BP 112/65 | HR 61 | Resp 16 | Ht 65.0 in | Wt 193.0 lb

## 2017-03-05 DIAGNOSIS — N912 Amenorrhea, unspecified: Secondary | ICD-10-CM

## 2017-03-05 DIAGNOSIS — N911 Secondary amenorrhea: Secondary | ICD-10-CM

## 2017-03-05 DIAGNOSIS — Z3202 Encounter for pregnancy test, result negative: Secondary | ICD-10-CM

## 2017-03-05 LAB — POCT URINE PREGNANCY: PREG TEST UR: NEGATIVE

## 2017-03-05 NOTE — Progress Notes (Signed)
   GYNECOLOGY OFFICE VISIT NOTE  History:  41 y.o. G2P1011 here today for evaluation of amenorrhea for the past 3 months.  Has lost 20 pounds since 10/2016.  Had some spotting last month, but no period since May 2018.  Negative UPT at home.  She denies any abnormal vaginal discharge, bleeding, pelvic pain or other concerns.   Past Medical History:  Diagnosis Date  . Anxiety   . Asthma    No PFT in records   . Fibroids   . GERD (gastroesophageal reflux disease)   . Neck pain, chronic 10/01/2012  . Test anxiety 10/01/2012  . 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 NO PAST SURGERIES      The following portions of the patient's history were reviewed and updated as appropriate: allergies, current medications, past family history, past medical history, past social history, past surgical history and problem list.   Health Maintenance:  Normal pap and negative HRHPV on 05/01/2016.    Review of Systems:  Pertinent items noted in HPI and remainder of comprehensive ROS otherwise negative.   Objective:  Physical Exam BP 112/65   Pulse 61   Resp 16   Ht 5\' 5"  (1.651 m)   Wt 193 lb (87.5 kg)   LMP 12/23/2016   BMI 32.12 kg/m  CONSTITUTIONAL: Well-developed, well-nourished female in no acute distress.  HENT:  Normocephalic, atraumatic. External right and left ear normal. Oropharynx is clear and moist EYES: Conjunctivae and EOM are normal. Pupils are equal, round, and reactive to light. No scleral icterus.  NECK: Normal range of motion, supple, no masses SKIN: Skin is warm and dry. No rash noted. Not diaphoretic. No erythema. No pallor. NEUROLOGIC: Alert and oriented to person, place, and time. Normal reflexes, muscle tone coordination. No cranial nerve deficit noted. PSYCHIATRIC: Normal mood and affect. Normal behavior. Normal judgment and thought content. CARDIOVASCULAR: Normal heart rate noted RESPIRATORY: Effort and breath sounds normal, no  problems with respiration noted ABDOMEN: Soft, no distention noted.   PELVIC: Deferred MUSCULOSKELETAL: Normal range of motion. No edema noted.  Results for orders placed or performed in visit on 03/05/17 (from the past 24 hour(s))  POCT urine pregnancy     Status: Normal   Collection Time: 03/05/17  4:06 PM  Result Value Ref Range   Preg Test, Ur Negative Negative    Assessment & Plan:  1. Amenorrhea, secondary Amenorrhea evaluation labs ordered, but patient told it was likely physiologic given recent weight loss/reset of hypothalamus-pituitary-ovarian axis.  Recommended Provera challenge, she declined this for now. Patient desires ultrasound. Will follow up results and manage accordingly. - POCT urine pregnancy - Follicle stimulating hormone - Beta HCG, Quant - TSH - Prolactin - Testosterone - Testosterone, free - Anti mullerian hormone - US Pelvis Complete; Future - US Transvaginal Non-OB; Future  Routine preventative health maintenance measures emphasized. Next pap due after 05/02/2017; if she is still amenorrheic without known etiology at that point, consider Provera challenge.  Please refer to After Visit Summary for other counseling recommendations.   Return in about 8 weeks (around 05/02/2017) for Annual exam and follow up.   Total face-to-face time with patient: 25 minutes. Over 50% of encounter was spent on counseling and coordination of care.   Verita Schneiders, MD, [place] Attending [place], [facility] for [employer], [place]

## 2017-03-05 NOTE — Patient Instructions (Signed)
Secondary Amenorrhea Secondary amenorrhea is the stopping of menstrual flow for 3-6 months in a female who has previously had periods. There are many possible causes. Most of these causes are not serious. Usually, treating the underlying problem causing the loss of menses will return your periods to normal. What are the causes? Some common and uncommon causes of not menstruating include:  Malnutrition.  Low blood sugar (hypoglycemia).  Polycystic ovary disease.  Stress or fear.  Breastfeeding.  Hormone imbalance.  Ovarian failure.  Medicines.  Extreme obesity.  Cystic fibrosis.  Low body weight or drastic weight reduction from any cause.  Early menopause.  Removal of ovaries or uterus.  Contraceptives.  Illness.  Long-term (chronic) illnesses.  Cushing syndrome.  Thyroid problems.  Birth control pills, patches, or vaginal rings for birth control.  What increases the risk? You may be at greater risk of secondary amenorrhea if:  You have a family history of this condition.  You have an eating disorder.  You do athletic training.  How is this diagnosed? A diagnosis is made by your health care provider taking a medical history and doing a physical exam. This will include a pelvic exam to check for problems with your reproductive organs. Pregnancy must be ruled out. Often, numerous blood tests are done to measure different hormones in the body. Urine testing may be done. Specialized exams (ultrasound, CT scan, MRI, or hysteroscopy) may have to be done as well as measuring the body mass index (BMI). How is this treated? Treatment depends on the cause of the amenorrhea. If an eating disorder is present, this can be treated with an adequate diet and therapy. Chronic illnesses may improve with treatment of the illness. Amenorrhea may be corrected with medicines, lifestyle changes, or surgery. If the amenorrhea cannot be corrected, it is sometimes possible to create a  false menstruation with medicines. Follow these instructions at home:  Maintain a healthy diet.  Manage weight problems.  Exercise regularly but not excessively.  Get adequate sleep.  Manage stress.  Be aware of changes in your menstrual cycle. Keep a record of when your periods occur. Note the date your period starts, how long it lasts, and any problems. Contact a health care provider if: Your symptoms do not get better with treatment. This information is not intended to replace advice given to you by your health care provider. Make sure you discuss any questions you have with your health care provider. Document Released: 09/10/2006 Document Revised: 01/05/2016 Document Reviewed: 01/15/2013 Elsevier Interactive Patient Education  2018 [employer]  

## 2017-03-06 LAB — FOLLICLE STIMULATING HORMONE: FSH: 16.2 m[IU]/mL

## 2017-03-06 LAB — TSH: TSH: 1.22 mIU/L

## 2017-03-06 LAB — TESTOSTERONE, FREE: TESTOSTERONE FREE: 2.7 pg/mL (ref 0.6–6.8)

## 2017-03-06 LAB — TESTOSTERONE: Testosterone: 19 ng/dL

## 2017-03-06 LAB — PROLACTIN: Prolactin: 12.7 ng/mL

## 2017-03-06 LAB — HCG, QUANTITATIVE, PREGNANCY: hCG, Beta Chain, Quant, S: <0.2 m[IU]/mL

## 2017-03-09 LAB — ANTI MULLERIAN HORMONE: AMH AssessR: <0.03 ng/mL

## 2017-03-14 ENCOUNTER — Ambulatory Visit (HOSPITAL_BASED_OUTPATIENT_CLINIC_OR_DEPARTMENT_OTHER): Payer: Commercial Managed Care - PPO

## 2017-12-21 ENCOUNTER — Other Ambulatory Visit: Payer: Self-pay

## 2017-12-21 ENCOUNTER — Emergency Department (HOSPITAL_BASED_OUTPATIENT_CLINIC_OR_DEPARTMENT_OTHER)
Admission: EM | Admit: 2017-12-21 | Discharge: 2017-12-21 | Disposition: A | Discharge status: Home / Self Care | Payer: PRIVATE HEALTH INSURANCE | Attending: Emergency Medicine | Admitting: Emergency Medicine

## 2017-12-21 ENCOUNTER — Emergency Department (HOSPITAL_BASED_OUTPATIENT_CLINIC_OR_DEPARTMENT_OTHER): Payer: PRIVATE HEALTH INSURANCE

## 2017-12-21 ENCOUNTER — Encounter (HOSPITAL_BASED_OUTPATIENT_CLINIC_OR_DEPARTMENT_OTHER): Payer: Self-pay | Admitting: Emergency Medicine

## 2017-12-21 DIAGNOSIS — Y929 Unspecified place or not applicable: Secondary | ICD-10-CM | POA: Diagnosis not present

## 2017-12-21 DIAGNOSIS — Z87891 Personal history of nicotine dependence: Secondary | ICD-10-CM | POA: Diagnosis not present

## 2017-12-21 DIAGNOSIS — Y999 Unspecified external cause status: Secondary | ICD-10-CM | POA: Insufficient documentation

## 2017-12-21 DIAGNOSIS — W231XXA Caught, crushed, jammed, or pinched between stationary objects, initial encounter: Secondary | ICD-10-CM | POA: Diagnosis not present

## 2017-12-21 DIAGNOSIS — J45909 Unspecified asthma, uncomplicated: Secondary | ICD-10-CM | POA: Insufficient documentation

## 2017-12-21 DIAGNOSIS — S6992XA Unspecified injury of left wrist, hand and finger(s), initial encounter: Secondary | ICD-10-CM | POA: Diagnosis present

## 2017-12-21 DIAGNOSIS — S61412A Laceration without foreign body of left hand, initial encounter: Secondary | ICD-10-CM | POA: Insufficient documentation

## 2017-12-21 DIAGNOSIS — Y9389 Activity, other specified: Secondary | ICD-10-CM | POA: Diagnosis not present

## 2017-12-21 MED ORDER — LIDOCAINE HCL (PF) 2 % IJ SOLN
INTRAMUSCULAR | Status: AC
  Administered 2017-12-21: 200 mg
  Filled 2017-12-21: qty 2

## 2017-12-21 MED ORDER — LIDOCAINE HCL 2 % IJ SOLN
10.0000 mL | Freq: Once | INTRAMUSCULAR | Status: AC
  Administered 2017-12-21: 200 mg
  Filled 2017-12-21: qty 10

## 2017-12-21 NOTE — ED Triage Notes (Signed)
Patient states that she had a washer fall onto her left hand just prior to arrival

## 2017-12-21 NOTE — ED Provider Notes (Signed)
[facility] EMERGENCY DEPARTMENT Provider Note   CSN: [id number] Arrival date & time: 12/21/17  1451     History   Chief Complaint Chief Complaint  Patient presents with  . Hand Injury    possible crush injury     HPI Susan Brewer is a 42 y.o. female hx of anxiety, asthma, reflux here presenting with left hand injury.  Patient states that she was moving washer and the washer slipped from the dolly and pinched her left thumb against the wall.  She noticed some bleeding and noticed a laceration there.  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  The history is provided by the patient.    Past Medical History:  Diagnosis Date  . Anxiety   . Asthma    No PFT in records   . Fibroids   . GERD (gastroesophageal reflux disease)   . Neck pain, chronic 10/01/2012  . Test anxiety 10/01/2012  . 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 URI (upper respiratory infection) 08/20/2014  . Acute rhinosinusitis 02/11/2014  . Neck and shoulder pain 11/10/2013  . GERD (gastroesophageal reflux disease), possible 11/10/2013  . 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 Neck pain, chronic 10/01/2012  . 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 Anxiety, mild 10/01/2012  . MENORRHAGIA 11/17/2007    Past Surgical History:  Procedure Laterality Date  . NO PAST SURGERIES       OB History    Gravida  2   Para  1   Term  1   Preterm      AB  1   Living  1     SAB      TAB      Ectopic  1   Multiple      Live Births               Home Medications    Prior to Admission medications   Medication Sig Start Date End Date Taking? Authorizing Provider  acetaminophen (TYLENOL) 500 MG tablet Take 500 mg by mouth every 6 (six) hours as needed.    [provider]  ALBUTEROL IN Inhale into the lungs.    [provider]  Dietary Management Product (RHEUMATE) CAPS Take 1 capsule by mouth daily. 01/25/17   [provider]  ibuprofen (ADVIL,MOTRIN) 800 MG tablet Take 1 tablet (800 mg total) by mouth every 8 (eight) hours as needed for mild pain. 09/09/14   Ward, Delice Bison, DO    Family History Family History  Problem Relation Age of Onset  . Asthma Son   . Cancer Maternal Aunt        Uterine cancer   . Cancer Maternal Grandmother        Lymphoma   . Sudden death Neg Hx   . Hypertension Neg Hx   . Heart attack Neg Hx   . Hyperlipidemia Neg Hx   . Diabetes Neg Hx     Social History Social History   Tobacco Use  . Smoking status: Former Smoker    Types: Cigarettes    Last attempt to quit: 03/26/2011    Years since quitting: 6.7  . Smokeless tobacco: Never Used  Substance Use Topics  . Alcohol use: Yes    Alcohol/week: 1.2 oz    Types: 2 Glasses of wine per week    Comment: wine seldom  . Drug use: No  Allergies   Penicillins   Review of Systems Review of Systems  Skin: Positive for wound.  All other systems reviewed and are negative.    Physical Exam Updated Vital Signs BP 128/81 (BP Location: Right Arm)   Pulse (!) 104   Temp 98.2 F (36.8 C) (Oral)   Resp 20   Ht 5\' 5"  (1.651 m)   Wt 88.5 kg (195 lb)   SpO2 100%   BMI 32.45 kg/m   Physical Exam  Constitutional: She appears well-developed.  HENT:  Head: Normocephalic and atraumatic.  Eyes: Pupils are equal, round, and reactive to light.  Neck: Normal range of motion.  Cardiovascular: Normal rate.  Pulmonary/Chest: Effort normal.  Abdominal: Soft.  Musculoskeletal:  Mild tenderness base of L thumb. There is a 3 cm laceration 1st web space. Dec sensation on the ulna side of the thumb, good capillary refill. Able to flex and extend thumb   Neurological: She is alert.  Skin: Skin is warm.  Psychiatric: She has a normal mood and affect.  Nursing note and vitals reviewed.    ED Treatments / Results  Labs (all labs ordered are listed, but only abnormal results are displayed) Labs Reviewed - No  data to display  EKG None  Radiology Dg Hand Complete Left  Result Date: 12/21/2017 CLINICAL DATA:  42 year old female with a history of fall and hand pain EXAM: LEFT HAND - COMPLETE 3+ VIEW COMPARISON:  None. FINDINGS: No acute displaced fracture. No radiopaque foreign body. Gas within the soft tissues between first and second metacarpal. Mild degenerative changes. IMPRESSION: Negative for acute bony abnormality. Soft tissue lucency in the soft tissues between the first and second metacarpal, potentially from a penetrating injury. No radiopaque foreign body. Electronically Signed   By: Corrie Mckusick D.O.   On: 12/21/2017 15:29    Procedures Procedures (including critical care time)  LACERATION REPAIR Performed by: Wandra Arthurs Authorized by: Wandra Arthurs Consent: Verbal consent obtained. Risks and benefits: risks, benefits and alternatives were discussed Consent given by: patient Patient identity confirmed: provided demographic data Prepped and Draped in normal sterile fashion Wound explored  Laceration Location: L thumb  Laceration Length: 3 cm  No Foreign Bodies seen or palpated  Anesthesia: local infiltration  Local anesthetic: lidocaine 2 % no epinephrine  Anesthetic total: 5 ml  Irrigation method: syringe Amount of cleaning: standard  Skin closure: 4-0 ethilon  Number of sutures: 5   Technique: simple interrupted   Patient tolerance: Patient tolerated the procedure well with no immediate complications.   Medications Ordered in ED Medications  lidocaine (XYLOCAINE) 2 % (with pres) injection 200 mg (has no administration in time range)     Initial Impression / Assessment and Plan / ED Course  I have reviewed the triage vital signs and the nursing notes.  Pertinent labs & imaging results that were available during my care of the patient were reviewed by me and considered in my medical decision making (see chart for details).     Susan Brewer is a 42 y.o.  female here with L hand injury. Xray showed no fractures. Laceration sutured with 5 stiches. No visible tendons but she has decreased sensation on the thumb. I told her that she may have nerve injury or just numbness from the swelling. Recommend suture removal in a week.    Final Clinical Impressions(s) / ED Diagnoses   Final diagnoses:  None    ED Discharge Orders    None  Drenda Freeze, MD 12/21/17 [phone]

## 2017-12-21 NOTE — Discharge Instructions (Addendum)
Take tylenol, motrin for pain.   Apply ice for swelling.   Suture removal in a week.   Your thumb numbness should improve in the next week. Consider following up with Dr. Fredna Dow from hand surgery if any concerns   Return to ER if you have purulent discharge from the wound, fever, severe pain or swelling, nail turning blue.

## 2018-03-26 ENCOUNTER — Ambulatory Visit: Payer: PRIVATE HEALTH INSURANCE | Admitting: Obstetrics & Gynecology

## 2018-04-16 ENCOUNTER — Ambulatory Visit (INDEPENDENT_AMBULATORY_CARE_PROVIDER_SITE_OTHER): Payer: PRIVATE HEALTH INSURANCE | Admitting: Obstetrics & Gynecology

## 2018-04-16 ENCOUNTER — Encounter: Payer: Self-pay | Admitting: Obstetrics & Gynecology

## 2018-04-16 VITALS — BP 117/69 | HR 70 | Wt 207.0 lb

## 2018-04-16 DIAGNOSIS — Z01419 Encounter for gynecological examination (general) (routine) without abnormal findings: Secondary | ICD-10-CM | POA: Diagnosis not present

## 2018-04-16 DIAGNOSIS — Z1151 Encounter for screening for human papillomavirus (HPV): Secondary | ICD-10-CM | POA: Diagnosis not present

## 2018-04-16 DIAGNOSIS — Z124 Encounter for screening for malignant neoplasm of cervix: Secondary | ICD-10-CM

## 2018-04-16 MED ORDER — DROSPIRENONE-ETHINYL ESTRADIOL 3-0.02 MG PO TABS: 1.0000 | ORAL_TABLET | Freq: Every day | ORAL | 11 refills | Status: AC

## 2018-04-16 NOTE — Progress Notes (Signed)
Subjective:    Susan Brewer is a 42 y.o. married P1 (59 yo son)  female who presents for an annual exam. The patient has no complaints today. The patient is sexually active. GYN screening history: last pap: was normal. The patient wears seatbelts: yes. The patient participates in regular exercise: yes. Has the patient ever been transfused or tattooed?: yes. The patient reports that there is not domestic violence in her life.   Menstrual History: OB History    Gravida  2   Para  1   Term  1   Preterm      AB  1   Living  1     SAB      TAB      Ectopic  1   Multiple      Live Births              Menarche age: 89 Patient's last menstrual period was 11/05/2017 (approximate).    The following portions of the patient's history were reviewed and updated as appropriate: allergies, current medications, past family history, past medical history, past social history, past surgical history and problem list.  Review of Systems Pertinent items are noted in HPI.   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 She hasn't had one since March this year. She has unprotected sex.  Mammogram through [facility] 2/19   Objective:    BP 117/69   Pulse 70   Wt 207 lb (93.9 kg)   LMP 11/05/2017 (Approximate)   BMI 34.45 kg/m   General Appearance:    Alert, cooperative, no distress, appears stated age  Head:    Normocephalic, without obvious abnormality, atraumatic  Eyes:    PERRL, conjunctiva/corneas clear, EOM's intact, fundi    benign, both eyes  Ears:    Normal TM's and external ear canals, both ears  Nose:   Nares normal, septum midline, mucosa normal, no drainage    or sinus tenderness  Throat:   Lips, mucosa, and tongue normal; teeth and gums normal  Neck:   Supple, symmetrical, trachea midline, no adenopathy;    thyroid:  no enlargement/tenderness/nodules; no carotid  bruit or JVD  Back:     Symmetric, no curvature, ROM normal, no CVA tenderness  Lungs:     Clear to auscultation bilaterally, respirations unlabored  Chest Wall:    No tenderness or deformity   Heart:    Regular rate and rhythm, S1 and S2 normal, no murmur, rub   or gallop  Breast Exam:    No tenderness, masses, or nipple abnormality  Abdomen:     Soft, non-tender, bowel sounds active all four quadrants,    no masses, no organomegaly  Genitalia:    Normal female without lesion, discharge or tenderness, normal size and shape, anteverted, mobile, non-tender, normal adnexal exam      Extremities:   Extremities normal, atraumatic, no cyanosis or edema  Pulses:   2+ and symmetric all extremities  Skin:   Skin color, texture, turgor normal, no rashes or lesions  Lymph nodes:   Cervical, supraclavicular, and axillary nodes normal  Neurologic:   CNII-XII intact, normal strength, sensation and reflexes    throughout  .    Assessment:    Healthy female exam.    Plan:     Thin prep Pap smear. with cotesting Amenorrhea with negative work up last year Start generic  yaz in 2 weeks after UPT here (NO UNPROTECTED SEX until then) BP check in 4 weeks

## 2018-04-16 NOTE — Progress Notes (Signed)
Last pap 04/21/16- negative

## 2018-04-18 LAB — CYTOLOGY - PAP
DIAGNOSIS: NEGATIVE
HPV (WINDOPATH): NOT DETECTED

## 2018-04-30 ENCOUNTER — Ambulatory Visit: Payer: PRIVATE HEALTH INSURANCE

## 2021-08-24 ENCOUNTER — Encounter (HOSPITAL_BASED_OUTPATIENT_CLINIC_OR_DEPARTMENT_OTHER): Payer: Self-pay

## 2021-08-24 ENCOUNTER — Emergency Department (HOSPITAL_BASED_OUTPATIENT_CLINIC_OR_DEPARTMENT_OTHER)
Admission: EM | Admit: 2021-08-24 | Discharge: 2021-08-24 | Disposition: A | Discharge status: Home / Self Care | Payer: No Typology Code available for payment source | Attending: Emergency Medicine | Admitting: Emergency Medicine

## 2021-08-24 ENCOUNTER — Other Ambulatory Visit: Payer: Self-pay

## 2021-08-24 DIAGNOSIS — R5383 Other fatigue: Secondary | ICD-10-CM

## 2021-08-24 DIAGNOSIS — R0981 Nasal congestion: Secondary | ICD-10-CM | POA: Diagnosis not present

## 2021-08-24 DIAGNOSIS — Z8616 Personal history of COVID-19: Secondary | ICD-10-CM | POA: Diagnosis not present

## 2021-08-24 DIAGNOSIS — R350 Frequency of micturition: Secondary | ICD-10-CM | POA: Insufficient documentation

## 2021-08-24 DIAGNOSIS — Z7951 Long term (current) use of inhaled steroids: Secondary | ICD-10-CM | POA: Diagnosis not present

## 2021-08-24 DIAGNOSIS — Z20822 Contact with and (suspected) exposure to covid-19: Secondary | ICD-10-CM | POA: Diagnosis not present

## 2021-08-24 DIAGNOSIS — I10 Essential (primary) hypertension: Secondary | ICD-10-CM | POA: Diagnosis present

## 2021-08-24 DIAGNOSIS — J45909 Unspecified asthma, uncomplicated: Secondary | ICD-10-CM | POA: Insufficient documentation

## 2021-08-24 LAB — CBC
HCT: 37.9 % (ref 36.0–46.0)
Hemoglobin: 13.3 g/dL (ref 12.0–15.0)
MCH: 32.6 pg (ref 26.0–34.0)
MCHC: 35.1 g/dL (ref 30.0–36.0)
MCV: 92.9 fL (ref 80.0–100.0)
Platelets: 259 10*3/uL (ref 150–400)
RBC: 4.08 MIL/uL (ref 3.87–5.11)
RDW: 13 % (ref 11.5–15.5)
WBC: 10.3 10*3/uL (ref 4.0–10.5)
nRBC: 0 % (ref 0.0–0.2)

## 2021-08-24 LAB — URINALYSIS, MICROSCOPIC (REFLEX)

## 2021-08-24 LAB — PREGNANCY, URINE: Preg Test, Ur: NEGATIVE

## 2021-08-24 LAB — BASIC METABOLIC PANEL
Anion gap: 9 (ref 5–15)
BUN: 22 mg/dL — ABNORMAL HIGH (ref 6–20)
CO2: 23 mmol/L (ref 22–32)
Calcium: 9.2 mg/dL (ref 8.9–10.3)
Chloride: 104 mmol/L (ref 98–111)
Creatinine, Ser: 0.99 mg/dL (ref 0.44–1.00)
GFR, Estimated: >60 mL/min (ref >60)
Glucose, Bld: 105 mg/dL — ABNORMAL HIGH (ref 70–99)
Potassium: 3.9 mmol/L (ref 3.5–5.1)
Sodium: 136 mmol/L (ref 135–145)

## 2021-08-24 LAB — URINALYSIS, ROUTINE W REFLEX MICROSCOPIC
Bilirubin Urine: NEGATIVE
Glucose, UA: NEGATIVE mg/dL
Ketones, ur: NEGATIVE mg/dL
Leukocytes,Ua: NEGATIVE
Nitrite: NEGATIVE
Protein, ur: NEGATIVE mg/dL
Specific Gravity, Urine: <=1.005 (ref 1.005–1.030)
pH: 6 (ref 5.0–8.0)

## 2021-08-24 LAB — TROPONIN I (HIGH SENSITIVITY): Troponin I (High Sensitivity): 4 ng/L (ref <18)

## 2021-08-24 NOTE — ED Notes (Signed)
Pt requested troponin and declined CXR with orders

## 2021-08-24 NOTE — ED Triage Notes (Signed)
Pt states she did feel well after a routine workout ~430pm-she took her BP abd it was 165/95-NAD-steady gait

## 2021-08-24 NOTE — ED Provider Notes (Signed)
[facility] EMERGENCY DEPARTMENT Provider Note   CSN: [id number] Arrival date & time: 08/24/21  1935     History  Chief Complaint  Patient presents with   Hypertension    Susan Brewer is a 46 y.o. female.  HPI     46 year old female with a history of asthma presents with concern for elevated blood pressures, acute sensation of exhaustion.  Husband has COVID, has been negative  Was on peloton 430 PM, rode 5min then felt significant exhaustion. HR 160s, consistent with normal exercise tachycardia, diaphoresis consistent with exercise. Laid down and HR decreased however BP found to be high, felt fatigue, tingling of fingers and toes Nausea, low appetite Fingers and toes tingling  165/95, normally blood pressures 110 Is an Therapist, sports,  Nephew had cath at 2yo Friend was shot, BP was high then as well  [employer], worried dehydrated, frequent urination started today, no dysuria  No fam hx of early CAD No smoking, eoth,drugs  No cough, no fever, no runny nose, no sore throat, no body aches, no diarrhea, no black or bloody stools  Mild congestion occurs sometimes Had COVID last Jan   Past Medical History:  Diagnosis Date   Anxiety    Asthma    No PFT in records    Fibroids    GERD (gastroesophageal reflux disease)    Neck pain, chronic 10/01/2012   Test anxiety 10/01/2012   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 Authorizing Provider  acetaminophen (TYLENOL) 500 MG tablet Take 500 mg by mouth every 6 (six) hours as needed.    [provider]  ALBUTEROL IN Inhale into the lungs.    [provider]  Dietary Management Product (RHEUMATE) CAPS Take 1 capsule by mouth daily. 01/25/17   [provider]  drospirenone-ethinyl estradiol (YAZ,GIANVI,LORYNA) 3-0.02 MG tablet Take 1 tablet by mouth daily. 04/16/18   Emily Filbert, MD  ibuprofen (ADVIL,MOTRIN) 800 MG  tablet Take 1 tablet (800 mg total) by mouth every 8 (eight) hours as needed for mild pain. Patient not taking: Reported on 04/16/2018 09/09/14   Ward, Delice Bison, DO  meloxicam [facility]) 7.5 MG tablet Take by mouth. 01/04/17   [provider]      Allergies    Penicillins    Review of Systems   Review of Systems See above Physical Exam Updated Vital Signs BP 135/76    Pulse 81    Temp 98.8 F (37.1 C) (Oral)    Resp 18    Ht 5\' 4"  (1.626 m)    Wt 100.2 kg    SpO2 98%    BMI 37.93 kg/m  Physical Exam Vitals and nursing note reviewed.  Constitutional:      General: She is not in acute distress.    Appearance: Normal appearance. She is well-developed. She is not ill-appearing or diaphoretic.  HENT:     Head: Normocephalic and atraumatic.  Eyes:     General: No visual field deficit.    Extraocular Movements: Extraocular movements intact.     Conjunctiva/sclera: Conjunctivae normal.     Pupils: Pupils are equal, round, and reactive to light.  Cardiovascular:     Rate and Rhythm: Normal rate and regular rhythm.     Pulses: Normal pulses.     Heart sounds: Normal heart sounds. No murmur heard.   No friction rub. No gallop.  Pulmonary:  Effort: Pulmonary effort is normal. No respiratory distress.     Breath sounds: Normal breath sounds. No wheezing or rales.  Abdominal:     General: There is no distension.     Palpations: Abdomen is soft.     Tenderness: There is no abdominal tenderness. There is no guarding.  Musculoskeletal:        General: No swelling or tenderness.     Cervical back: Normal range of motion.  Skin:    General: Skin is warm and dry.     Findings: No erythema or rash.  Neurological:     General: No focal deficit present.     Mental Status: She is alert and oriented to person, place, and time.     GCS: GCS eye subscore is 4. GCS verbal subscore is 5. GCS motor subscore is 6.     Cranial Nerves: No cranial nerve deficit, dysarthria or facial asymmetry.      Sensory: No sensory deficit.     Motor: No weakness or tremor.     Coordination: Coordination normal. Finger-Nose-Finger Test normal.     Gait: Gait normal.    ED Results / Procedures / Treatments   Labs (all labs ordered are listed, but only abnormal results are displayed) Labs Reviewed  BASIC METABOLIC PANEL - Abnormal; Notable for the following components:      Result Value   Glucose, Bld 105 (*)    BUN 22 (*)    All other components within normal limits  URINALYSIS, ROUTINE W REFLEX MICROSCOPIC - Abnormal; Notable for the following components:   Hgb urine dipstick TRACE (*)    All other components within normal limits  URINALYSIS, MICROSCOPIC (REFLEX) - Abnormal; Notable for the following components:   Bacteria, UA RARE (*)    All other components within normal limits  RESP PANEL BY RT-PCR (FLU A&B, COVID) ARPGX2  CBC  PREGNANCY, URINE  TROPONIN I (HIGH SENSITIVITY)    EKG EKG Interpretation  Date/Time:  Thursday August 24 2021 19:55:23 EST Ventricular Rate:  104 PR Interval:  158 QRS Duration: 84 QT Interval:  320 QTC Calculation: 420 R Axis:   97 Text Interpretation: Sinus tachycardia Rightward axis Borderline ECG When compared with ECG of 09-Sep-2014 09:54, Rate has increased, nonspecific TW present Confirmed by Gareth Morgan [phone]) on 08/24/2021 9:23:50 PM  Radiology No results found.  Procedures Procedures    Medications Ordered in ED Medications - No data to display  ED Course/ Medical Decision Making/ A&P                           Medical Decision Making  46 year old female with a history of asthma presents with concern for elevated blood pressures, acute sensation of exhaustion.   DDx includes anemia, electrolyte abnormality, ACS, dehydration, infection, CVA.  No headache or acute neurologic symptoms with exception of bilateral tingling ands and toes not consistent with central etiology/CVA/ICH.    Labs show no clinically significant  anemia or electrolyte abnormalities. UA not consistent with UTI. Pregnancy test negative.   EKG with elevated rate, nonspecific tw changes likely rate related.  Given no dyspnea, hypoxia, no asymmetric leg swelling, have low clinical suspicion for PE. Troponin negative and given 5hr since symptom onset and high sensitivity number doubt ACS.   BP improved after time in ED, unclear if elevation secondary to stress of other symptoms. Possible symptoms related to dehydration, early viral syndome.  Recommend continued monitoring of BP,  strict return precautions. Patient discharged in stable condition with understanding of reasons to return.         Final Clinical Impression(s) / ED Diagnoses Final diagnoses:  Other fatigue  Hypertension, unspecified type    Rx / DC Orders ED Discharge Orders     None         Gareth Morgan, MD 08/25/21 1107

## 2021-08-25 LAB — RESP PANEL BY RT-PCR (FLU A&B, COVID) ARPGX2
Influenza A by PCR: NEGATIVE
Influenza B by PCR: NEGATIVE
SARS Coronavirus 2 by RT PCR: NEGATIVE
